# Patient Record
Sex: Male | Born: 1985 | Race: White | Hispanic: No | Marital: Married | Smoking: Current every day smoker
Health system: Southern US, Community
[De-identification: ages and names within clinical notes are randomized; demographics above are authoritative.]

## PROBLEM LIST (undated history)

## (undated) DIAGNOSIS — K311 Adult hypertrophic pyloric stenosis: Secondary | ICD-10-CM

## (undated) DIAGNOSIS — F418 Other specified anxiety disorders: Secondary | ICD-10-CM

## (undated) HISTORY — PX: PYLOROMYOTOMY: SUR1063

---

## 2005-10-23 ENCOUNTER — Emergency Department (HOSPITAL_COMMUNITY): Admission: EM | Admit: 2005-10-23 | Discharge: 2005-10-23 | Payer: Self-pay | Admitting: Emergency Medicine

## 2005-11-01 ENCOUNTER — Ambulatory Visit: Payer: Self-pay | Admitting: Family Medicine

## 2005-11-10 ENCOUNTER — Encounter (HOSPITAL_COMMUNITY): Admission: RE | Admit: 2005-11-10 | Discharge: 2005-12-10 | Payer: Self-pay | Admitting: Endocrinology

## 2005-11-18 ENCOUNTER — Ambulatory Visit: Payer: Self-pay | Admitting: Family Medicine

## 2005-11-30 ENCOUNTER — Emergency Department (HOSPITAL_COMMUNITY): Admission: EM | Admit: 2005-11-30 | Discharge: 2005-11-30 | Payer: Self-pay | Admitting: Emergency Medicine

## 2006-01-03 ENCOUNTER — Ambulatory Visit: Payer: Self-pay | Admitting: Family Medicine

## 2006-01-18 ENCOUNTER — Ambulatory Visit: Payer: Self-pay | Admitting: Family Medicine

## 2006-09-13 ENCOUNTER — Emergency Department (HOSPITAL_COMMUNITY): Admission: EM | Admit: 2006-09-13 | Discharge: 2006-09-14 | Payer: Self-pay | Admitting: Emergency Medicine

## 2006-09-17 ENCOUNTER — Emergency Department (HOSPITAL_COMMUNITY): Admission: EM | Admit: 2006-09-17 | Discharge: 2006-09-17 | Payer: Self-pay | Admitting: Occupational Therapy

## 2006-11-14 ENCOUNTER — Encounter: Payer: Self-pay | Admitting: Family Medicine

## 2006-11-14 DIAGNOSIS — J438 Other emphysema: Secondary | ICD-10-CM | POA: Insufficient documentation

## 2006-11-14 DIAGNOSIS — M545 Low back pain: Secondary | ICD-10-CM | POA: Insufficient documentation

## 2006-11-14 DIAGNOSIS — E0590 Thyrotoxicosis, unspecified without thyrotoxic crisis or storm: Secondary | ICD-10-CM | POA: Insufficient documentation

## 2006-11-14 DIAGNOSIS — F17200 Nicotine dependence, unspecified, uncomplicated: Secondary | ICD-10-CM | POA: Insufficient documentation

## 2006-11-14 DIAGNOSIS — F329 Major depressive disorder, single episode, unspecified: Secondary | ICD-10-CM

## 2006-11-14 DIAGNOSIS — F411 Generalized anxiety disorder: Secondary | ICD-10-CM | POA: Insufficient documentation

## 2006-11-14 DIAGNOSIS — J45909 Unspecified asthma, uncomplicated: Secondary | ICD-10-CM | POA: Insufficient documentation

## 2006-12-31 ENCOUNTER — Emergency Department (HOSPITAL_COMMUNITY): Admission: EM | Admit: 2006-12-31 | Discharge: 2007-01-01 | Payer: Self-pay | Admitting: Emergency Medicine

## 2007-01-06 ENCOUNTER — Emergency Department (HOSPITAL_COMMUNITY): Admission: EM | Admit: 2007-01-06 | Discharge: 2007-01-06 | Payer: Self-pay | Admitting: Emergency Medicine

## 2007-04-28 ENCOUNTER — Emergency Department (HOSPITAL_COMMUNITY): Admission: EM | Admit: 2007-04-28 | Discharge: 2007-04-28 | Payer: Self-pay | Admitting: Emergency Medicine

## 2007-05-13 ENCOUNTER — Emergency Department (HOSPITAL_COMMUNITY): Admission: EM | Admit: 2007-05-13 | Discharge: 2007-05-13 | Payer: Self-pay | Admitting: Emergency Medicine

## 2007-10-14 ENCOUNTER — Emergency Department (HOSPITAL_COMMUNITY): Admission: EM | Admit: 2007-10-14 | Discharge: 2007-10-14 | Payer: Self-pay | Admitting: Emergency Medicine

## 2007-11-18 ENCOUNTER — Emergency Department (HOSPITAL_COMMUNITY): Admission: EM | Admit: 2007-11-18 | Discharge: 2007-11-18 | Payer: Self-pay | Admitting: Emergency Medicine

## 2008-01-20 ENCOUNTER — Emergency Department (HOSPITAL_COMMUNITY): Admission: EM | Admit: 2008-01-20 | Discharge: 2008-01-20 | Payer: Self-pay | Admitting: Emergency Medicine

## 2008-03-03 ENCOUNTER — Emergency Department (HOSPITAL_COMMUNITY): Admission: EM | Admit: 2008-03-03 | Discharge: 2008-03-03 | Payer: Self-pay | Admitting: Emergency Medicine

## 2008-06-01 ENCOUNTER — Emergency Department (HOSPITAL_COMMUNITY): Admission: EM | Admit: 2008-06-01 | Discharge: 2008-06-01 | Payer: Self-pay | Admitting: Emergency Medicine

## 2008-08-20 ENCOUNTER — Ambulatory Visit (HOSPITAL_COMMUNITY): Admission: RE | Admit: 2008-08-20 | Discharge: 2008-08-20 | Payer: Self-pay | Admitting: Family Medicine

## 2008-08-22 ENCOUNTER — Emergency Department (HOSPITAL_COMMUNITY): Admission: EM | Admit: 2008-08-22 | Discharge: 2008-08-22 | Payer: Self-pay | Admitting: Emergency Medicine

## 2009-02-17 ENCOUNTER — Emergency Department (HOSPITAL_COMMUNITY): Admission: EM | Admit: 2009-02-17 | Discharge: 2009-02-17 | Payer: Self-pay | Admitting: Emergency Medicine

## 2009-04-09 ENCOUNTER — Emergency Department (HOSPITAL_COMMUNITY): Admission: EM | Admit: 2009-04-09 | Discharge: 2009-04-09 | Payer: Self-pay | Admitting: Emergency Medicine

## 2009-06-21 ENCOUNTER — Emergency Department (HOSPITAL_COMMUNITY): Admission: EM | Admit: 2009-06-21 | Discharge: 2009-06-21 | Payer: Self-pay | Admitting: Emergency Medicine

## 2009-11-07 ENCOUNTER — Emergency Department (HOSPITAL_COMMUNITY): Admission: EM | Admit: 2009-11-07 | Discharge: 2009-11-07 | Payer: Self-pay | Admitting: Emergency Medicine

## 2010-04-03 ENCOUNTER — Emergency Department (HOSPITAL_COMMUNITY): Admission: EM | Admit: 2010-04-03 | Discharge: 2010-04-03 | Payer: Self-pay | Admitting: Emergency Medicine

## 2010-09-30 ENCOUNTER — Emergency Department (HOSPITAL_COMMUNITY)
Admission: EM | Admit: 2010-09-30 | Discharge: 2010-09-30 | Payer: Self-pay | Source: Home / Self Care | Admitting: Emergency Medicine

## 2011-01-10 LAB — BASIC METABOLIC PANEL
BUN: 17 mg/dL (ref 6–23)
CO2: 28 mEq/L (ref 19–32)
Calcium: 9.6 mg/dL (ref 8.4–10.5)
GFR calc non Af Amer: >60 mL/min (ref >60)
Glucose, Bld: 112 mg/dL — ABNORMAL HIGH (ref 70–99)
Potassium: 3.1 mEq/L — ABNORMAL LOW (ref 3.5–5.1)

## 2011-01-10 LAB — URINALYSIS, ROUTINE W REFLEX MICROSCOPIC
Leukocytes, UA: NEGATIVE
Nitrite: NEGATIVE
Protein, ur: 30 mg/dL — AB

## 2011-01-16 LAB — CBC
HCT: 44.3 % (ref 39.0–52.0)
Hemoglobin: 15.1 g/dL (ref 13.0–17.0)
MCHC: 34.2 g/dL (ref 30.0–36.0)
MCV: 98.4 fL (ref 78.0–100.0)
RBC: 4.5 MIL/uL (ref 4.22–5.81)
WBC: 11.6 10*3/uL — ABNORMAL HIGH (ref 4.0–10.5)

## 2011-01-16 LAB — URINALYSIS, ROUTINE W REFLEX MICROSCOPIC
Ketones, ur: NEGATIVE mg/dL
Nitrite: NEGATIVE
pH: 7.5 (ref 5.0–8.0)

## 2011-01-16 LAB — DIFFERENTIAL
Basophils Relative: 1 % (ref 0–1)
Eosinophils Absolute: 0.2 10*3/uL (ref 0.0–0.7)
Eosinophils Relative: 2 % (ref 0–5)
Lymphs Abs: 3.8 10*3/uL (ref 0.7–4.0)
Monocytes Absolute: 1 10*3/uL (ref 0.1–1.0)
Monocytes Relative: 9 % (ref 3–12)

## 2011-01-16 LAB — BASIC METABOLIC PANEL
CO2: 29 mEq/L (ref 19–32)
Chloride: 102 mEq/L (ref 96–112)
Creatinine, Ser: 0.82 mg/dL (ref 0.4–1.5)
GFR calc Af Amer: >60 mL/min (ref >60)
Potassium: 3.3 mEq/L — ABNORMAL LOW (ref 3.5–5.1)
Sodium: 139 mEq/L (ref 135–145)

## 2011-01-16 LAB — URINE MICROSCOPIC-ADD ON

## 2011-01-17 LAB — COMPREHENSIVE METABOLIC PANEL
AST: 17 U/L (ref 0–37)
Albumin: 4.6 g/dL (ref 3.5–5.2)
Alkaline Phosphatase: 103 U/L (ref 39–117)
BUN: 16 mg/dL (ref 6–23)
CO2: 28 mEq/L (ref 19–32)
Chloride: 103 mEq/L (ref 96–112)
GFR calc non Af Amer: >60 mL/min (ref >60)
Potassium: 3.3 mEq/L — ABNORMAL LOW (ref 3.5–5.1)
Total Bilirubin: 0.6 mg/dL (ref 0.3–1.2)

## 2011-01-17 LAB — URINALYSIS, ROUTINE W REFLEX MICROSCOPIC
Bilirubin Urine: NEGATIVE
Glucose, UA: NEGATIVE mg/dL
Hgb urine dipstick: NEGATIVE
Protein, ur: NEGATIVE mg/dL
Urobilinogen, UA: 2 mg/dL — ABNORMAL HIGH (ref 0.0–1.0)

## 2011-01-17 LAB — DIFFERENTIAL
Basophils Absolute: 0 10*3/uL (ref 0.0–0.1)
Basophils Relative: 0 % (ref 0–1)
Eosinophils Relative: 2 % (ref 0–5)
Monocytes Absolute: 0.9 10*3/uL (ref 0.1–1.0)
Neutro Abs: 8.9 10*3/uL — ABNORMAL HIGH (ref 1.7–7.7)

## 2011-01-17 LAB — CBC
HCT: 42.4 % (ref 39.0–52.0)
Platelets: 171 10*3/uL (ref 150–400)
RBC: 4.36 MIL/uL (ref 4.22–5.81)
WBC: 13.4 10*3/uL — ABNORMAL HIGH (ref 4.0–10.5)

## 2011-02-05 LAB — URINALYSIS, ROUTINE W REFLEX MICROSCOPIC
Bilirubin Urine: NEGATIVE
Ketones, ur: NEGATIVE mg/dL
Nitrite: NEGATIVE
Protein, ur: NEGATIVE mg/dL
Urobilinogen, UA: 0.2 mg/dL (ref 0.0–1.0)

## 2011-02-07 LAB — BASIC METABOLIC PANEL
Calcium: 9.5 mg/dL (ref 8.4–10.5)
GFR calc non Af Amer: >60 mL/min (ref >60)
Glucose, Bld: 138 mg/dL — ABNORMAL HIGH (ref 70–99)
Potassium: 3 mEq/L — ABNORMAL LOW (ref 3.5–5.1)
Sodium: 141 mEq/L (ref 135–145)

## 2011-02-07 LAB — URINALYSIS, ROUTINE W REFLEX MICROSCOPIC
Leukocytes, UA: NEGATIVE
Nitrite: NEGATIVE

## 2011-02-07 LAB — URINE MICROSCOPIC-ADD ON

## 2011-03-18 NOTE — H&P (Signed)
NAME:  George Gross, George Gross                   ACCOUNT NO.:  [id number]   MEDICAL RECORD NO.:  [id number]           PATIENT TYPE:  EMS   LOCATION:  ED                             FACILITY:   PHYSICIAN:  Colleen Can. Deborah Chalk, M.D.DATE OF BIRTH:  09/02/1945   DATE OF ADMISSION:  07/23/2008  DATE OF DISCHARGE:  06/01/2008                              HISTORY & PHYSICAL   CHIEF COMPLAINT:  Chest pain.   HISTORY OF PRESENT ILLNESS:  George Gross is a 25 year old white male who  has known ischemic heart disease.  He has had previous stenting of the  right coronary artery as well as to the left circumflex.  His last  catheterization was in January of this year.  He has had followup stress  testing in July, which was unremarkable.  He presents to the office as a  work-in appointment on July 21, 2008 with complaints of chest  tightness over the past 1 to 2 weeks.  It has been associated with some  left hand and arm numbness and tingling.  His discomfort has been very  fleeting.  His Ranexa was increased over the weekend with really no  significant results.  He does feel somewhat foggy and sleepy.  There was  some concern for possible arrhythmia and a 30-day telemetry monitoring  was ordered.  However, the patient refused.  He continues to just have a  generalized feeling of malaise and is now referred for cardiac  catheterization.   PAST MEDICAL HISTORY:  1. Known ischemic heart disease with previous PCI to the right      coronary.  He has a 2.5 x 28 mm PROMUS stent in the right coronary      artery.  He had a 2.75 x 15 mm PROMUS stent placed to the left      circumflex in January 2009.  2. Hyperlipidemia with concern for possible statin intolerance.  3. Left nephrolithiasis.  4. Longstanding history of colitis.  5. Osteoarthritis.  6. History of left wrist surgery.  7. Left shoulder spur.   ALLERGIES:  None.   CURRENT MEDICATIONS:  1. Nexium 40 mg b.i.d.  2. Plavix 75 mg a day.  3. Vitamin D  50,000 units q. week.  4. Red yeast rice occasionally.  5. Tylenol arthritis daily.  6. Tums twice a day.  7. Aspirin 81 mg a day.  8. Centrum Silver twice a day.  9. Folic acid daily.  10.Vitamin E daily.  11.Vitamin C twice a day.  12.Glucosamine chondroitin 4 tablets a day.  13.Imuran 50 mg b.i.d.  14.Azulfidine 500 mg 4 tablets a day.  15.Prednisone 5 mg a day.   FAMILY HISTORY:  Unchanged.   SOCIAL HISTORY:  Unchanged.   REVIEW OF SYSTEMS:  As noted above.  Is otherwise unremarkable.   EXAM:  CONSTITUTIONAL:  He is currently in no acute distress.  VITAL SIGNS:  His weight is 212-1/2 pounds, blood pressure 130/78, heart  rate 55 and regular, respirations are 18.  He is afebrile.  SKIN:  Warm and dry.  Color is unremarkable.  LUNGS:  Clear.  HEART:  Shows a regular rhythm.  ABDOMEN:  Soft positive bowel sounds, nontender.  EXTREMITIES:  Without edema.  NEUROLOGIC:  Shows no gross focal deficits.   PERTINENT LABS:  Pending.   OVERALL IMPRESSION:  1. Chest pain.  2. Known ischemic heart disease.  3. Hyperlipidemia.  4. History of colitis.   PLAN:  We will proceed on with diagnostic cardiac catheterization.  The  procedure risks and benefits have been reviewed and he is willing to  proceed on Wednesday July 23, 2008.      Sharlee Blew, N.P.      Colleen Can. Deborah Chalk, M.D.  Electronically Signed    LC/MEDQ  D:  07/22/2008  T:  07/22/2008  Job:  [id number]

## 2011-08-01 LAB — URINALYSIS, ROUTINE W REFLEX MICROSCOPIC
Bilirubin Urine: NEGATIVE
Ketones, ur: NEGATIVE
Leukocytes, UA: NEGATIVE
Nitrite: NEGATIVE
Specific Gravity, Urine: >1.03 — ABNORMAL HIGH
Urobilinogen, UA: 0.2
pH: 5.5

## 2011-08-01 LAB — URINE MICROSCOPIC-ADD ON

## 2011-08-08 LAB — URINALYSIS, ROUTINE W REFLEX MICROSCOPIC
Bilirubin Urine: NEGATIVE
Hgb urine dipstick: NEGATIVE
Ketones, ur: NEGATIVE
Nitrite: NEGATIVE
Specific Gravity, Urine: 1.02
pH: 7.5

## 2011-08-16 LAB — BASIC METABOLIC PANEL
Chloride: 104
GFR calc Af Amer: >60
GFR calc non Af Amer: >60
Potassium: 3.6
Sodium: 139

## 2011-08-16 LAB — URINE MICROSCOPIC-ADD ON

## 2011-08-16 LAB — CBC
HCT: 39.4
Hemoglobin: 13.8
MCV: 94.1
Platelets: 151
RBC: 4.19 — ABNORMAL LOW
WBC: 8.3

## 2011-08-16 LAB — URINALYSIS, ROUTINE W REFLEX MICROSCOPIC
Bilirubin Urine: NEGATIVE
Glucose, UA: NEGATIVE
Specific Gravity, Urine: >1.03 — ABNORMAL HIGH

## 2011-08-16 LAB — DIFFERENTIAL
Eosinophils Relative: 3
Lymphocytes Relative: 32
Lymphs Abs: 2.6
Monocytes Relative: 10

## 2011-09-09 ENCOUNTER — Encounter: Payer: Self-pay | Admitting: *Deleted

## 2011-09-09 ENCOUNTER — Emergency Department (HOSPITAL_COMMUNITY)
Admission: EM | Admit: 2011-09-09 | Discharge: 2011-09-10 | Disposition: A | Discharge status: Home / Self Care | Payer: Medicaid Other | Attending: Emergency Medicine | Admitting: Emergency Medicine

## 2011-09-09 DIAGNOSIS — H5710 Ocular pain, unspecified eye: Secondary | ICD-10-CM | POA: Insufficient documentation

## 2011-09-09 DIAGNOSIS — F341 Dysthymic disorder: Secondary | ICD-10-CM | POA: Insufficient documentation

## 2011-09-09 DIAGNOSIS — H538 Other visual disturbances: Secondary | ICD-10-CM | POA: Insufficient documentation

## 2011-09-09 DIAGNOSIS — S058X9A Other injuries of unspecified eye and orbit, initial encounter: Secondary | ICD-10-CM | POA: Insufficient documentation

## 2011-09-09 DIAGNOSIS — H539 Unspecified visual disturbance: Secondary | ICD-10-CM | POA: Insufficient documentation

## 2011-09-09 DIAGNOSIS — F17200 Nicotine dependence, unspecified, uncomplicated: Secondary | ICD-10-CM | POA: Insufficient documentation

## 2011-09-09 DIAGNOSIS — T1590XA Foreign body on external eye, part unspecified, unspecified eye, initial encounter: Secondary | ICD-10-CM | POA: Insufficient documentation

## 2011-09-09 DIAGNOSIS — S0500XA Injury of conjunctiva and corneal abrasion without foreign body, unspecified eye, initial encounter: Secondary | ICD-10-CM

## 2011-09-09 DIAGNOSIS — J438 Other emphysema: Secondary | ICD-10-CM | POA: Insufficient documentation

## 2011-09-09 HISTORY — DX: Other specified anxiety disorders: F41.8

## 2011-09-09 MED ORDER — ALBUTEROL SULFATE HFA 108 (90 BASE) MCG/ACT IN AERS: 2.0000 | INHALATION_SPRAY | RESPIRATORY_TRACT | Status: DC | PRN

## 2011-09-09 MED ORDER — FLUORESCEIN SODIUM 1 MG OP STRP
ORAL_STRIP | OPHTHALMIC | Status: AC
  Filled 2011-09-09: qty 1

## 2011-09-09 MED ORDER — CIPROFLOXACIN HCL 0.3 % OP SOLN
1.0000 [drp] | OPHTHALMIC | Status: AC
  Administered 2011-09-10: 1 [drp] via OPHTHALMIC
  Filled 2011-09-09: qty 2.5

## 2011-09-09 MED ORDER — TETRACAINE HCL 0.5 % OP SOLN
1.0000 [drp] | Freq: Once | OPHTHALMIC | Status: DC
  Filled 2011-09-09: qty 2

## 2011-09-09 NOTE — ED Provider Notes (Signed)
Scribed for George Roller, MD, the patient was seen in room APA16A/APA16A . This chart was scribed by Ellie Lunch.   CSN: [id number] Arrival date & time: 09/09/2011 10:10 PM   First MD Initiated Contact with Patient 09/09/11 2310      Chief Complaint  Patient presents with  . Eye Pain  . Foreign Body in Eye    (Consider location/radiation/quality/duration/timing/severity/associated sxs/prior treatment) The history is provided by the patient. No language interpreter was used.   Pt seen at 11:10 PM George Gross is a 25 y.o. male who presents to the Emergency Department complaining of eye pain.  Pt was chopping wood ~5pm today and says a piece of wood hit him in face. Pt c/o acute onset eye pain with some associated blurry vision. Constant sx since onset. Nothing improves or worsens pain. No photophobia, n/v fevers, or chills.  Pt does not wear glasses or corrective lenses.   Past Medical History  Diagnosis Date  . Emphysema   . Asthma   . Depression with anxiety     History reviewed. No pertinent past surgical history.  Family History  Problem Relation Age of Onset  . Thyroid disease Father   . Hypertension Father     History  Substance Use Topics  . Smoking status: Current Everyday Smoker  . Smokeless tobacco: Not on file  . Alcohol Use: No    Review of Systems  Constitutional: Negative for chills.  Eyes: Positive for pain and visual disturbance. Negative for photophobia.  Gastrointestinal: Negative for nausea and vomiting.    Allergies  Bee venom and Other  Home Medications   Current Outpatient Rx  Name Route Sig Dispense Refill  . UNKNOWN TO PATIENT Ophthalmic Apply to eye as needed. EYE WASH(Name UNKNOWN): Consistent use to remove foreign object from eye.       BP 131/87  Pulse 129  Temp(Src) 98.1 F (36.7 C) (Oral)  Resp 20  Ht 5\' 8"  (1.727 m)  Wt 134 lb (60.782 kg)  BMI 20.37 kg/m2  SpO2 100%  Physical Exam  Nursing note and vitals  reviewed. Constitutional: He appears well-developed and well-nourished.  HENT:  Head: Normocephalic and atraumatic.  Mouth/Throat: Oropharynx is clear and moist.  Eyes:       fluorescein and tetracaine drops administered for PE.  Has 3 linear abrasions to the L side of the L cornea  Cardiovascular: Regular rhythm and normal heart sounds.   Pulmonary/Chest: Effort normal and breath sounds normal. No respiratory distress.  Neurological: He is alert. Coordination normal.  Skin: Skin is warm and dry.    ED Course  Procedures (including critical care time) OTHER DATA REVIEWED: Nursing notes, vital signs, and past medical records reviewed.   DIAGNOSTIC STUDIES: Oxygen Saturation is 100% on room air, normal by my interpretation.     1. Corneal abrasion       MDM  Pt has corneal abrasion, no signs of puncture, VS normalized prior to d/c.  Requests albuterol Rx for his occasional DOE and due to recent dx of emphysema and inability to get to his PMD.  I personally performed the services described in this documentation, which was scribed in my presence. The recorded information has been reviewed and considered.           George Roller, MD 09/09/11 [phone]

## 2011-09-09 NOTE — ED Notes (Signed)
Pt was splitting wood earlier and a piece of wood or something flew up in his eye.

## 2011-09-10 MED ORDER — CIPROFLOXACIN HCL 0.3 % OP SOLN
OPHTHALMIC | Status: AC
  Filled 2011-09-10: qty 2.5

## 2012-04-21 ENCOUNTER — Emergency Department (HOSPITAL_COMMUNITY)
Admission: EM | Admit: 2012-04-21 | Discharge: 2012-04-21 | Disposition: A | Discharge status: Home / Self Care | Payer: Medicaid Other

## 2013-01-30 ENCOUNTER — Encounter (HOSPITAL_COMMUNITY): Payer: Self-pay | Admitting: *Deleted

## 2013-01-30 ENCOUNTER — Emergency Department (HOSPITAL_COMMUNITY)
Admission: EM | Admit: 2013-01-30 | Discharge: 2013-01-30 | Disposition: A | Discharge status: Home / Self Care | Payer: MEDICAID | Attending: Emergency Medicine | Admitting: Emergency Medicine

## 2013-01-30 DIAGNOSIS — J438 Other emphysema: Secondary | ICD-10-CM | POA: Insufficient documentation

## 2013-01-30 DIAGNOSIS — J45909 Unspecified asthma, uncomplicated: Secondary | ICD-10-CM | POA: Insufficient documentation

## 2013-01-30 DIAGNOSIS — Z79899 Other long term (current) drug therapy: Secondary | ICD-10-CM | POA: Insufficient documentation

## 2013-01-30 DIAGNOSIS — R000 Tachycardia, unspecified: Secondary | ICD-10-CM | POA: Insufficient documentation

## 2013-01-30 DIAGNOSIS — F1120 Opioid dependence, uncomplicated: Secondary | ICD-10-CM | POA: Insufficient documentation

## 2013-01-30 DIAGNOSIS — F1110 Opioid abuse, uncomplicated: Secondary | ICD-10-CM

## 2013-01-30 DIAGNOSIS — F17200 Nicotine dependence, unspecified, uncomplicated: Secondary | ICD-10-CM | POA: Insufficient documentation

## 2013-01-30 DIAGNOSIS — F341 Dysthymic disorder: Secondary | ICD-10-CM | POA: Insufficient documentation

## 2013-01-30 DIAGNOSIS — F1123 Opioid dependence with withdrawal: Secondary | ICD-10-CM

## 2013-01-30 DIAGNOSIS — F19939 Other psychoactive substance use, unspecified with withdrawal, unspecified: Secondary | ICD-10-CM | POA: Insufficient documentation

## 2013-01-30 MED ORDER — LORAZEPAM 1 MG PO TABS
1.0000 mg | ORAL_TABLET | Freq: Once | ORAL | Status: AC
  Administered 2013-01-30: 1 mg via ORAL
  Filled 2013-01-30: qty 1

## 2013-01-30 MED ORDER — CLONIDINE HCL 0.1 MG PO TABS: 0.1000 mg | ORAL_TABLET | Freq: Three times a day (TID) | ORAL | Status: DC | PRN

## 2013-01-30 MED ORDER — LOPERAMIDE HCL 2 MG PO CAPS: 2.0000 mg | ORAL_CAPSULE | Freq: Four times a day (QID) | ORAL | Status: DC | PRN

## 2013-01-30 MED ORDER — LORAZEPAM 1 MG PO TABS: 1.0000 mg | ORAL_TABLET | Freq: Four times a day (QID) | ORAL | Status: DC | PRN

## 2013-01-30 MED ORDER — CLONIDINE HCL 0.1 MG PO TABS
0.1000 mg | ORAL_TABLET | Freq: Once | ORAL | Status: AC
  Administered 2013-01-30: 0.1 mg via ORAL
  Filled 2013-01-30: qty 1

## 2013-01-30 MED ORDER — ONDANSETRON 4 MG PO TBDP: 4.0000 mg | ORAL_TABLET | Freq: Four times a day (QID) | ORAL | Status: DC | PRN

## 2013-01-30 MED ORDER — ONDANSETRON 4 MG PO TBDP
4.0000 mg | ORAL_TABLET | Freq: Once | ORAL | Status: AC
  Administered 2013-01-30: 4 mg via ORAL
  Filled 2013-01-30: qty 1

## 2013-01-30 NOTE — ED Provider Notes (Signed)
History    27 year old male with opiate dependence. Patient has been abusing prescription opiates for period of several years. He's been unable to quit secondary to wheezing is a withdrawal symptoms. He has 3 small children and states that he needs to quit to be a better father. He last used today. Denies any other drug use aside from occasional marijuana. No suicidal homicidal ideation. No hallucinations. Currently he feels anxious, otherwise no other complaints.  CSN: [id number]  Arrival date & time 01/30/13  1641   First MD Initiated Contact with Patient 01/30/13 1806      Chief Complaint  Patient presents with  . V70.1    (Consider location/radiation/quality/duration/timing/severity/associated sxs/prior treatment) HPI  Past Medical History  Diagnosis Date  . Emphysema   . Asthma   . Depression with anxiety     History reviewed. No pertinent past surgical history.  Family History  Problem Relation Age of Onset  . Thyroid disease Father   . Hypertension Father     History  Substance Use Topics  . Smoking status: Current Every Day Smoker    Types: Cigarettes  . Smokeless tobacco: Not on file  . Alcohol Use: No      Review of Systems  All systems reviewed and negative, other than as noted in HPI.   Allergies  Bee venom and Other  Home Medications   Current Outpatient Rx  Name  Route  Sig  Dispense  Refill  . EXPIRED: albuterol (PROVENTIL HFA;VENTOLIN HFA) 108 (90 BASE) MCG/ACT inhaler   Inhalation   Inhale 2 puffs into the lungs every 4 (four) hours as needed for wheezing or shortness of breath.   1 Inhaler   3   . cloNIDine (CATAPRES) 0.1 MG tablet   Oral   Take 1 tablet (0.1 mg total) by mouth 3 (three) times daily as needed (withdrawal symptoms).   20 tablet   0   . loperamide (IMODIUM) 2 MG capsule   Oral   Take 1 capsule (2 mg total) by mouth 4 (four) times daily as needed for diarrhea or loose stools.   20 capsule   0   . LORazepam  (ATIVAN) 1 MG tablet   Oral   Take 1 tablet (1 mg total) by mouth every 6 (six) hours as needed for anxiety.   20 tablet   0   . ondansetron (ZOFRAN-ODT) 4 MG disintegrating tablet   Oral   Take 1 tablet (4 mg total) by mouth every 6 (six) hours as needed for nausea.   20 tablet   0   . UNKNOWN TO PATIENT   Ophthalmic   Apply to eye as needed. EYE WASH(Name UNKNOWN): Consistent use to remove foreign object from eye.           BP 130/88  Pulse 120  Temp(Src) 97.8 F (36.6 C) (Oral)  Resp 20  Ht 5\' 9"  (1.753 m)  Wt 125 lb (56.7 kg)  BMI 18.45 kg/m2  SpO2 100%  Physical Exam  Nursing note and vitals reviewed. Constitutional: He appears well-developed and well-nourished. No distress.  HENT:  Head: Normocephalic and atraumatic.  Eyes: Conjunctivae are normal. Right eye exhibits no discharge. Left eye exhibits no discharge.  Neck: Neck supple.  Cardiovascular: Regular rhythm and normal heart sounds.  Exam reveals no gallop and no friction rub.   No murmur heard. Mild tachycardia with a regular rhythm  Pulmonary/Chest: Effort normal and breath sounds normal. No respiratory distress.  Abdominal: Soft. He exhibits no distension.  There is no tenderness.  Musculoskeletal: He exhibits no edema and no tenderness.  Neurological: He is alert.  Skin: Skin is warm and dry.  Psychiatric: His behavior is normal. Thought content normal.  Mildly anxious. Responding to questions appropriately. Does not appear to be responding to internal stimuli. No evidence of cognitive impairment.    ED Course  Procedures (including critical care time)  Labs Reviewed - No data to display No results found.   1. Opiate abuse, continuous   2. Opiate withdrawal       MDM  27 year old male with opiate withdrawal symptoms. No suicidal or homicidal ideation. No psychosis. Plan symptomatic treatment. Prescriptions provided. Resources provided. Emergent return precautions  discussed.        Raeford Razor, MD 01/30/13 2219

## 2013-01-30 NOTE — ED Notes (Signed)
Pt wants detox from opioids, last took 3-4 hours ago, pt tearful in triage room, pt denies SI/HI

## 2013-01-30 NOTE — ED Notes (Signed)
Discharge instructions reviewed with pt, questions answered. Pt verbalized understanding.  

## 2013-06-17 ENCOUNTER — Encounter (HOSPITAL_COMMUNITY): Payer: Self-pay | Admitting: Emergency Medicine

## 2013-06-17 ENCOUNTER — Emergency Department (HOSPITAL_COMMUNITY)
Admission: EM | Admit: 2013-06-17 | Discharge: 2013-06-17 | Disposition: A | Discharge status: Home / Self Care | Payer: Medicaid Other | Attending: Emergency Medicine | Admitting: Emergency Medicine

## 2013-06-17 DIAGNOSIS — R220 Localized swelling, mass and lump, head: Secondary | ICD-10-CM | POA: Insufficient documentation

## 2013-06-17 DIAGNOSIS — Z8659 Personal history of other mental and behavioral disorders: Secondary | ICD-10-CM | POA: Insufficient documentation

## 2013-06-17 DIAGNOSIS — R599 Enlarged lymph nodes, unspecified: Secondary | ICD-10-CM | POA: Insufficient documentation

## 2013-06-17 DIAGNOSIS — F17200 Nicotine dependence, unspecified, uncomplicated: Secondary | ICD-10-CM | POA: Insufficient documentation

## 2013-06-17 DIAGNOSIS — L988 Other specified disorders of the skin and subcutaneous tissue: Secondary | ICD-10-CM | POA: Insufficient documentation

## 2013-06-17 DIAGNOSIS — J45909 Unspecified asthma, uncomplicated: Secondary | ICD-10-CM | POA: Insufficient documentation

## 2013-06-17 DIAGNOSIS — K047 Periapical abscess without sinus: Secondary | ICD-10-CM

## 2013-06-17 MED ORDER — NAPROXEN 500 MG PO TABS: 500.0000 mg | ORAL_TABLET | Freq: Two times a day (BID) | ORAL | Status: DC

## 2013-06-17 MED ORDER — HYDROCODONE-ACETAMINOPHEN 5-325 MG PO TABS
1.0000 | ORAL_TABLET | Freq: Once | ORAL | Status: AC
  Administered 2013-06-17: 1 via ORAL
  Filled 2013-06-17: qty 1

## 2013-06-17 MED ORDER — CLINDAMYCIN HCL 150 MG PO CAPS: 150.0000 mg | ORAL_CAPSULE | Freq: Four times a day (QID) | ORAL | Status: DC

## 2013-06-17 MED ORDER — HYDROCODONE-ACETAMINOPHEN 5-325 MG PO TABS: 1.0000 | ORAL_TABLET | ORAL | Status: DC | PRN

## 2013-06-17 MED ORDER — CLINDAMYCIN HCL 150 MG PO CAPS
300.0000 mg | ORAL_CAPSULE | Freq: Once | ORAL | Status: AC
  Administered 2013-06-17: 300 mg via ORAL
  Filled 2013-06-17: qty 2

## 2013-06-17 NOTE — ED Provider Notes (Signed)
History/physical exam/procedure(s) were performed by non-physician practitioner and as supervising physician I was immediately available for consultation/collaboration. I have reviewed all notes and am in agreement with care and plan.   Hilario Quarry, MD 06/17/13 2350

## 2013-06-17 NOTE — ED Notes (Signed)
Patient c/o dental abscess.

## 2013-06-17 NOTE — ED Provider Notes (Signed)
CSN: [id number]     Arrival date & time 06/17/13  2034 History     First MD Initiated Contact with Patient 06/17/13 2102     Chief Complaint  Patient presents with  . Dental Pain   (Consider location/radiation/quality/duration/timing/severity/associated sxs/prior Treatment) HPI George Gross is a 27 y.o. male who presents to the ED with pain in his left jaw. He describes the pain as throbbing. The pain started a few days ago and has gotten worse with swelling. He has a pimple on the left side of his face that he has been picking and thought that may be the source of infection. He denies fever. He does have a dentist and had his wisdom teeth removed and is planning another appointment.   Past Medical History  Diagnosis Date  . Emphysema   . Asthma   . Depression with anxiety    Past Surgical History  Procedure Laterality Date  . Pyloromyotomy     Family History  Problem Relation Age of Onset  . Thyroid disease Father   . Hypertension Father    History  Substance Use Topics  . Smoking status: Current Every Day Smoker    Types: Cigarettes  . Smokeless tobacco: Not on file  . Alcohol Use: Yes    Review of Systems  Constitutional: Negative for fever and chills.  HENT: Positive for dental problem. Negative for neck pain.   Respiratory: Negative for shortness of breath.   Gastrointestinal: Negative for nausea and vomiting.  Skin: Positive for wound (pimple on face).  Neurological: Negative for headaches.  Psychiatric/Behavioral: The patient is not nervous/anxious.     Allergies  Bee venom and Other  Home Medications   Current Outpatient Rx  Name  Route  Sig  Dispense  Refill  . ibuprofen (ADVIL,MOTRIN) 200 MG tablet   Oral   Take 800 mg by mouth 3 (three) times daily as needed for pain.         Marland Kitchen EXPIRED: albuterol (PROVENTIL HFA;VENTOLIN HFA) 108 (90 BASE) MCG/ACT inhaler   Inhalation   Inhale 2 puffs into the lungs every 4 (four) hours as needed for wheezing or  shortness of breath.   1 Inhaler   3    BP 115/70  Pulse 105  Temp(Src) 98.6 F (37 C) (Oral)  Resp 24  Wt 130 lb (58.968 kg)  BMI 19.19 kg/m2  SpO2 100% Physical Exam  Nursing note and vitals reviewed. Constitutional: He is oriented to person, place, and time. He appears well-developed and well-nourished.  HENT:  Head:    Mouth/Throat: Uvula is midline, oropharynx is clear and moist and mucous membranes are normal. Dental abscesses present.    There is a small raised pustular area on the left side of the face that is tender with palpation. There is no facial erythema noted.  There is tenderness on palpation of the gum surrounding the first and second lower molars on the left. There is a swollen fluctuant area palpated on the gum.  This swelling is separate from the pustular area on the face.   Eyes: EOM are normal.  Neck: Normal range of motion. Neck supple.  Cardiovascular: Normal rate.   Pulmonary/Chest: Effort normal.  Abdominal: Soft. There is no tenderness.  Musculoskeletal: Normal range of motion.  Lymphadenopathy:    He has cervical adenopathy (left).  Neurological: He is alert and oriented to person, place, and time. No cranial nerve deficit.  Skin: Skin is warm and dry.  Psychiatric: He has a  normal mood and affect. His behavior is normal.    ED Course   Procedures   MDM  27 y.o. male with dental abscess and facial swelling. Will treat with antibiotics and pain medication and the patient will follow up with his dentist as soon as possible.  Discussed with the patient and all questioned fully answered. He will return if any problems arise.    Medication List    TAKE these medications       clindamycin 150 MG capsule  Commonly known as:  CLEOCIN  Take 1 capsule (150 mg total) by mouth every 6 (six) hours.     HYDROcodone-acetaminophen 5-325 MG per tablet  Commonly known as:  NORCO/VICODIN  Take 1 tablet by mouth every 4 (four) hours as needed.      naproxen 500 MG tablet  Commonly known as:  NAPROSYN  Take 1 tablet (500 mg total) by mouth 2 (two) times daily.      ASK your doctor about these medications       albuterol 108 (90 BASE) MCG/ACT inhaler  Commonly known as:  PROVENTIL HFA;VENTOLIN HFA  Inhale 2 puffs into the lungs every 4 (four) hours as needed for wheezing or shortness of breath.     ibuprofen 200 MG tablet  Commonly known as:  ADVIL,MOTRIN  Take 800 mg by mouth 3 (three) times daily as needed for pain.         Janne Napoleon, [place] 06/17/13 2132

## 2013-06-17 NOTE — ED Notes (Signed)
Pt has swelling of rt jaw, Pt says he picks at his face and thinks abscess is due to this.  Denies any tooth problem.  Alert,

## 2014-03-04 ENCOUNTER — Emergency Department (HOSPITAL_COMMUNITY)
Admission: EM | Admit: 2014-03-04 | Discharge: 2014-03-04 | Disposition: A | Discharge status: Home / Self Care | Payer: Medicaid Other | Attending: Emergency Medicine | Admitting: Emergency Medicine

## 2014-03-04 ENCOUNTER — Encounter (HOSPITAL_COMMUNITY): Payer: Self-pay | Admitting: Emergency Medicine

## 2014-03-04 DIAGNOSIS — R0602 Shortness of breath: Secondary | ICD-10-CM | POA: Insufficient documentation

## 2014-03-04 DIAGNOSIS — Z792 Long term (current) use of antibiotics: Secondary | ICD-10-CM | POA: Insufficient documentation

## 2014-03-04 DIAGNOSIS — Z8659 Personal history of other mental and behavioral disorders: Secondary | ICD-10-CM | POA: Insufficient documentation

## 2014-03-04 DIAGNOSIS — F17200 Nicotine dependence, unspecified, uncomplicated: Secondary | ICD-10-CM | POA: Insufficient documentation

## 2014-03-04 DIAGNOSIS — J45909 Unspecified asthma, uncomplicated: Secondary | ICD-10-CM | POA: Insufficient documentation

## 2014-03-04 DIAGNOSIS — R55 Syncope and collapse: Secondary | ICD-10-CM | POA: Insufficient documentation

## 2014-03-04 DIAGNOSIS — Z79899 Other long term (current) drug therapy: Secondary | ICD-10-CM | POA: Insufficient documentation

## 2014-03-04 DIAGNOSIS — R42 Dizziness and giddiness: Secondary | ICD-10-CM | POA: Insufficient documentation

## 2014-03-04 DIAGNOSIS — R61 Generalized hyperhidrosis: Secondary | ICD-10-CM | POA: Insufficient documentation

## 2014-03-04 DIAGNOSIS — E876 Hypokalemia: Secondary | ICD-10-CM | POA: Insufficient documentation

## 2014-03-04 LAB — I-STAT CHEM 8, ED
BUN: 21 mg/dL (ref 6–23)
CHLORIDE: 106 meq/L (ref 96–112)
CREATININE: 1.1 mg/dL (ref 0.50–1.35)
Calcium, Ion: 1.18 mmol/L (ref 1.12–1.23)
Glucose, Bld: 96 mg/dL (ref 70–99)
HCT: 46 % (ref 39.0–52.0)
Hemoglobin: 15.6 g/dL (ref 13.0–17.0)
POTASSIUM: 2.8 meq/L — AB (ref 3.7–5.3)
SODIUM: 144 meq/L (ref 137–147)
TCO2: 25 mmol/L (ref 0–100)

## 2014-03-04 MED ORDER — SODIUM CHLORIDE 0.9 % IV BOLUS (SEPSIS)
1000.0000 mL | Freq: Once | INTRAVENOUS | Status: AC
  Administered 2014-03-04: 1000 mL via INTRAVENOUS

## 2014-03-04 MED ORDER — ALBUTEROL SULFATE HFA 108 (90 BASE) MCG/ACT IN AERS: 1.0000 | INHALATION_SPRAY | Freq: Four times a day (QID) | RESPIRATORY_TRACT | Status: AC | PRN

## 2014-03-04 MED ORDER — POTASSIUM CHLORIDE CRYS ER 20 MEQ PO TBCR
40.0000 meq | EXTENDED_RELEASE_TABLET | Freq: Once | ORAL | Status: AC
  Administered 2014-03-04: 40 meq via ORAL
  Filled 2014-03-04: qty 2

## 2014-03-04 NOTE — ED Notes (Signed)
Pt was moving items in basement, started having shortness of breath and generalized weakness.

## 2014-03-04 NOTE — Discharge Instructions (Signed)
Hypokalemia Hypokalemia means that the amount of potassium in the blood is lower than normal.Potassium is a chemical, called an electrolyte, that helps regulate the amount of fluid in the body. It also stimulates muscle contraction and helps nerves function properly.Most of the body's potassium is inside of cells, and only a very small amount is in the blood. Because the amount in the blood is so small, minor changes can be life-threatening. CAUSES  Antibiotics.  Diarrhea or vomiting.  Using laxatives too much, which can cause diarrhea.  Chronic kidney disease.  Water pills (diuretics).  Eating disorders (bulimia).  Low magnesium level.  Sweating a lot. SIGNS AND SYMPTOMS  Weakness.  Constipation.  Fatigue.  Muscle cramps.  Mental confusion.  Skipped heartbeats or irregular heartbeat (palpitations).  Tingling or numbness. DIAGNOSIS  Your health care provider can diagnose hypokalemia with blood tests. In addition to checking your potassium level, your health care provider may also check other lab tests. TREATMENT Hypokalemia can be treated with potassium supplements taken by mouth or adjustments in your current medicines. If your potassium level is very low, you may need to get potassium through a vein (IV) and be monitored in the hospital. A diet high in potassium is also helpful. Foods high in potassium are:  Nuts, such as peanuts and pistachios.  Seeds, such as sunflower seeds and pumpkin seeds.  Peas, lentils, and lima beans.  Whole grain and bran cereals and breads.  Fresh fruit and vegetables, such as apricots, avocado, bananas, cantaloupe, kiwi, oranges, tomatoes, asparagus, and potatoes.  Orange and tomato juices.  Red meats.  Fruit yogurt. HOME CARE INSTRUCTIONS  Take all medicines as prescribed by your health care provider.  Maintain a healthy diet by including nutritious food, such as fruits, vegetables, nuts, whole grains, and lean meats.  If  you are taking a laxative, be sure to follow the directions on the label. SEEK MEDICAL CARE IF:  Your weakness gets worse.  You feel your heart pounding or racing.  You are vomiting or having diarrhea.  You are diabetic and having trouble keeping your blood glucose in the normal range. SEEK IMMEDIATE MEDICAL CARE IF:  You have chest pain, shortness of breath, or dizziness.  You are vomiting or having diarrhea for more than 2 days.  You faint. MAKE SURE YOU:   Understand these instructions.  Will watch your condition.  Will get help right away if you are not doing well or get worse. Document Released: 10/17/2005 Document Revised: 08/07/2013 Document Reviewed: 04/19/2013 [employer] Patient Information 2014 [employer].  

## 2014-03-04 NOTE — ED Provider Notes (Signed)
CSN: [id number]     Arrival date & time 03/04/14  2104 History  This chart was scribed for Joya Gaskinsonald W Addyson Traub, MD by Dorothey Basemania Sutton, ED Scribe. This patient was seen in room APA01/APA01 and the patient's care was started at 9:31 PM.    Chief Complaint  Patient presents with  . Weakness   Patient is a 28 y.o. male presenting with weakness. The history is provided by the patient. No language interpreter was used.  Weakness This is a new problem. The current episode started 3 to 5 hours ago. The problem occurs constantly. The problem has not changed since onset.Associated symptoms include shortness of breath. Pertinent negatives include no chest pain. Nothing aggravates the symptoms. Nothing relieves the symptoms. He has tried rest for the symptoms. The treatment provided mild relief.   HPI Comments: George Gross is a 28 y.o. Male with a history of emphysema  who presents to the Emergency Department complaining of dizziness, lightheadedness, and weakness to the bilateral legs earlier today while moving boxes in his basement.  Soon after he felt SOB.  Patient states that his symptoms presented about 10 minutes after he started moving the boxes and were alleviated after he lay down to rest. Patient states that his symptoms have been gradually improving, but that the lightheadedness has been most persistent. He denies any potential exposure to mold or allergens in the basement. He states that he has been eating and drinking normally today. Patient denies taking any medications today. He denies syncope, chest pain, vomiting, diarrhea, blood in the stool. He denies any recent extended travel/prolonged immobilization or surgeries. He denies illicit drug use. Patient also has a history of depression with anxiety. Patient is a current everyday smoker.   Past Medical History  Diagnosis Date  . Emphysema   . Asthma   . Depression with anxiety    Past Surgical History  Procedure Laterality Date  . Pyloromyotomy      Family History  Problem Relation Age of Onset  . Thyroid disease Father   . Hypertension Father    History  Substance Use Topics  . Smoking status: Current Every Day Smoker    Types: Cigarettes  . Smokeless tobacco: Not on file  . Alcohol Use: Yes    Review of Systems  Constitutional: Positive for diaphoresis. Negative for appetite change.  Respiratory: Positive for shortness of breath.   Cardiovascular: Negative for chest pain.  Gastrointestinal: Negative for vomiting, diarrhea and blood in stool.  Neurological: Positive for dizziness, weakness and light-headedness. Negative for syncope.  Psychiatric/Behavioral: The patient is nervous/anxious.   All other systems reviewed and are negative.     Allergies  Bee venom and Other  Home Medications   Prior to Admission medications   Medication Sig Start Date End Date Taking? Authorizing Provider  albuterol (PROVENTIL HFA;VENTOLIN HFA) 108 (90 BASE) MCG/ACT inhaler Inhale 2 puffs into the lungs every 4 (four) hours as needed for wheezing or shortness of breath. 09/09/11 09/08/12  Vida RollerBrian D Miller, MD  clindamycin (CLEOCIN) 150 MG capsule Take 1 capsule (150 mg total) by mouth every 6 (six) hours. 06/17/13   Hope Orlene OchM Neese, NP  HYDROcodone-acetaminophen (NORCO/VICODIN) 5-325 MG per tablet Take 1 tablet by mouth every 4 (four) hours as needed. 06/17/13   Hope Orlene OchM Neese, NP  ibuprofen (ADVIL,MOTRIN) 200 MG tablet Take 800 mg by mouth 3 (three) times daily as needed for pain.    Historical Provider, MD  naproxen (NAPROSYN) 500 MG tablet Take 1  tablet (500 mg total) by mouth 2 (two) times daily. 06/17/13   Hope Orlene OchM Neese, NP   Triage Vitals: BP 155/98  Pulse 108  Temp(Src) 97.6 F (36.4 C) (Oral)  Resp 16  SpO2 100%  Physical Exam CONSTITUTIONAL: Well developed/well nourished, anxious HEAD: Normocephalic/atraumatic EYES: EOMI/PERRL ENMT: Mucous membranes moist NECK: supple no meningeal signs SPINE:entire spine nontender CV: S1/S2  noted, no murmurs/rubs/gallops noted LUNGS: Lungs are clear to auscultation bilaterally, no apparent distress ABDOMEN: soft, nontender, no rebound or guarding GU:no cva tenderness NEURO: Pt is awake/alert, moves all extremitiesx4, no arm or leg drift, no facial droop, no ataxia EXTREMITIES: pulses normal, full ROM SKIN: warm, color normal PSYCH: anxious  ED Course  Procedures   DIAGNOSTIC STUDIES: Oxygen Saturation is 100% on room air, normal by my interpretation.    COORDINATION OF CARE: 9:37 PM- Ordered EKG, I-stat chem 8. Ordered IV fluids and potassium chloride to manage symptoms. Discussed treatment plan with patient at bedside and patient verbalized agreement.  Pt tachycardic upon standing He appears anxious but no other acute findings He reported that he felt lightheaded while moving boxes, then became anxious and SOB I doubt ACS/PE at this time He is now improve Hypokalemia noted and this was replenished He requests albuterol for discharge. Advised to quit smoking Referred to PCP for further testing and repeat potassium   Labs Review Labs Reviewed  I-STAT CHEM 8, ED - Abnormal; Notable for the following:    Potassium 2.8 (*)    All other components within normal limits      EKG Interpretation   Date/Time:  Tuesday Mar 04 2014 21:24:38 EDT Ventricular Rate:  104 PR Interval:  201 QRS Duration: 104 QT Interval:  368 QTC Calculation: 484 R Axis:   70 Text Interpretation:  Sinus tachycardia Borderline prolonged PR interval  Right atrial enlargement Consider right ventricular hypertrophy Borderline  prolonged QT interval No previous ECGs available Confirmed by Bebe ShaggyWICKLINE   MD, Dorinda HillNALD ([id number]) on 03/04/2014 9:36:02 PM      MDM   Final diagnoses:  Near syncope  Hypokalemia    Nursing notes including past medical history and social history reviewed and considered in documentation Labs/vital reviewed and considered    I personally performed the services  described in this documentation, which was scribed in my presence. The recorded information has been reviewed and is accurate.       Joya Gaskinsonald W Christropher Gintz, MD 03/04/14 [phone]

## 2014-07-31 ENCOUNTER — Emergency Department (HOSPITAL_COMMUNITY)
Admission: EM | Admit: 2014-07-31 | Discharge: 2014-07-31 | Disposition: A | Discharge status: Home / Self Care | Payer: Medicaid Other | Attending: Emergency Medicine | Admitting: Emergency Medicine

## 2014-07-31 ENCOUNTER — Encounter (HOSPITAL_COMMUNITY): Payer: Self-pay | Admitting: Emergency Medicine

## 2014-07-31 DIAGNOSIS — S61512A Laceration without foreign body of left wrist, initial encounter: Secondary | ICD-10-CM | POA: Diagnosis not present

## 2014-07-31 DIAGNOSIS — Z79899 Other long term (current) drug therapy: Secondary | ICD-10-CM | POA: Insufficient documentation

## 2014-07-31 DIAGNOSIS — Z8659 Personal history of other mental and behavioral disorders: Secondary | ICD-10-CM | POA: Diagnosis not present

## 2014-07-31 DIAGNOSIS — W260XXA Contact with knife, initial encounter: Secondary | ICD-10-CM | POA: Diagnosis not present

## 2014-07-31 DIAGNOSIS — Z23 Encounter for immunization: Secondary | ICD-10-CM | POA: Insufficient documentation

## 2014-07-31 DIAGNOSIS — W208XXA Other cause of strike by thrown, projected or falling object, initial encounter: Secondary | ICD-10-CM | POA: Insufficient documentation

## 2014-07-31 DIAGNOSIS — Z720 Tobacco use: Secondary | ICD-10-CM | POA: Insufficient documentation

## 2014-07-31 DIAGNOSIS — Y9289 Other specified places as the place of occurrence of the external cause: Secondary | ICD-10-CM | POA: Diagnosis not present

## 2014-07-31 DIAGNOSIS — Y9389 Activity, other specified: Secondary | ICD-10-CM | POA: Diagnosis not present

## 2014-07-31 DIAGNOSIS — J45909 Unspecified asthma, uncomplicated: Secondary | ICD-10-CM | POA: Insufficient documentation

## 2014-07-31 DIAGNOSIS — J439 Emphysema, unspecified: Secondary | ICD-10-CM | POA: Diagnosis not present

## 2014-07-31 MED ORDER — BACITRACIN-NEOMYCIN-POLYMYXIN 400-5-5000 EX OINT
TOPICAL_OINTMENT | Freq: Once | CUTANEOUS | Status: AC
  Administered 2014-07-31: 1 via TOPICAL
  Filled 2014-07-31: qty 1

## 2014-07-31 MED ORDER — NAPROXEN 500 MG PO TABS: 500.0000 mg | ORAL_TABLET | Freq: Two times a day (BID) | ORAL | Status: DC

## 2014-07-31 MED ORDER — LIDOCAINE HCL (PF) 1 % IJ SOLN
INTRAMUSCULAR | Status: AC
  Administered 2014-07-31: 01:00:00
  Filled 2014-07-31: qty 5

## 2014-07-31 MED ORDER — TETANUS-DIPHTH-ACELL PERTUSSIS 5-2.5-18.5 LF-MCG/0.5 IM SUSP
0.5000 mL | Freq: Once | INTRAMUSCULAR | Status: AC
  Administered 2014-07-31: 0.5 mL via INTRAMUSCULAR
  Filled 2014-07-31: qty 0.5

## 2014-07-31 NOTE — ED Notes (Signed)
Pt has laceration to the left wrist. Pt states he was messing around with knives and one fell off the counter striking his left wrist. Pt denies any si/hi ideations.

## 2014-07-31 NOTE — ED Provider Notes (Signed)
Medical screening examination/treatment/procedure(s) were conducted as a shared visit with non-physician practitioner(s) and myself.  I personally evaluated the patient during the encounter. Patient is a 28 year old male who presents with complaints of left wrist laceration that occurred when he states he dropped a block of knives.  On exam, vitals are stable and the patient is afebrile. Head is atraumatic, normocephalic. Neck is supple. Examination of the left wrist reveals a 4 cm laceration to the distal forearm on the ulnar side. There is no tendon involvement and he is able to flex and extend his wrist and fingers without limitation.  [facility] as per her documentation. The wound was dressed. He is to perform local wound care and suture removal in one week.     Geoffery Lyonsouglas Feleica Fulmore, MD 07/31/14 [phone]

## 2014-07-31 NOTE — ED Provider Notes (Signed)
CSN: [id number]     Arrival date & time 07/31/14  0000 History   First MD Initiated Contact with Patient 07/31/14 0017     Chief Complaint  Patient presents with  . Laceration     (Consider location/radiation/quality/duration/timing/severity/associated sxs/prior Treatment) Patient is a 28 y.o. male presenting with skin laceration. The history is provided by the patient.  Laceration Location:  Shoulder/arm Shoulder/arm laceration location:  L wrist Depth:  Through underlying tissue Quality: straight   Bleeding: controlled   Time since incident:  30 minutes Laceration mechanism:  Knife Pain details:    Quality:  Aching   Severity:  Mild   Timing:  Constant   Progression:  Unchanged Foreign body present:  No foreign bodies Relieved by:  None tried Worsened by:  Nothing tried Ineffective treatments:  None tried Tetanus status:  Unknown  KERIN CECCHI is a 28 y.o. male who presents to the ED with a laceration to the left wrist. Patient states that he knocked over the wooden block that was holding several knives and when they fell on caught his wrist. He denies causing the laceration on purpose. He denies S/I or H/I. He denies any other injuries.   Past Medical History  Diagnosis Date  . Emphysema   . Asthma   . Depression with anxiety    Past Surgical History  Procedure Laterality Date  . Pyloromyotomy     Family History  Problem Relation Age of Onset  . Thyroid disease Father   . Hypertension Father    History  Substance Use Topics  . Smoking status: Current Every Day Smoker    Types: Cigarettes  . Smokeless tobacco: Not on file  . Alcohol Use: Yes    Review of Systems Negative except as stated in HPI   Allergies  Bee venom and Other  Home Medications   Prior to Admission medications   Medication Sig Start Date End Date Taking? Authorizing Provider  albuterol (PROVENTIL HFA;VENTOLIN HFA) 108 (90 BASE) MCG/ACT inhaler Inhale 1-2 puffs into the lungs every 6  (six) hours as needed for wheezing or shortness of breath. 03/04/14   Joya Gaskins, MD   BP 126/77  Pulse 120  Temp(Src) 99 F (37.2 C) (Oral)  Resp 18  Ht 5\' 8"  (1.727 m)  Wt 130 lb (58.968 kg)  BMI 19.77 kg/m2  SpO2 100% Physical Exam  Nursing note and vitals reviewed. Constitutional: He is oriented to person, place, and time. He appears well-developed and well-nourished. No distress.  HENT:  Head: Normocephalic and atraumatic.  Eyes: EOM are normal.  Neck: Neck supple.  Cardiovascular: Normal rate.   Pulmonary/Chest: Effort normal.  Musculoskeletal: Normal range of motion.       Left wrist: He exhibits tenderness and laceration. He exhibits normal range of motion, no bony tenderness and no deformity.  Full flexion and extension of the wrist without difficulty, good strength and touch sensation.   Neurological: He is alert and oriented to person, place, and time. No cranial nerve deficit.  Skin: Skin is warm and dry.  Psychiatric: He has a normal mood and affect. His behavior is normal.    ED Course  Procedures (including critical care time) Labs Review LACERATION REPAIR Performed by: NEESE,HOPE Authorized by: NEESE,HOPE Consent: Verbal consent obtained. Risks and benefits: risks, benefits and alternatives were discussed Consent given by: patient Patient identity confirmed: provided demographic data Prepped and Draped in normal sterile fashion Wound explored  Laceration Location: left wrist  Laceration Length: 4cm  No Foreign Bodies seen or palpated  Anesthesia: local infiltration  Local anesthetic: lidocaine 1% without epinephrine  Anesthetic total: 2 ml  Irrigation method: syringe Amount of cleaning: standard  Skin closure: 4-0 prolene  Number of sutures: 6  Technique: interrupted  Patient tolerance: Patient tolerated the procedure well with no immediate complications.   MDM  28 y.o. male with accidental laceration to the left wrist just prior  to arrival to the ED. Stable for discharge without neurovascular deficits. He will follow up in 10 days for suture removal. He will return sooner for signs of infection or other problems.    [facility] OchM Neese, NP 07/31/14 [phone]

## 2014-07-31 NOTE — ED Notes (Signed)
NP at bedside.

## 2015-03-05 ENCOUNTER — Encounter (HOSPITAL_COMMUNITY): Payer: Self-pay | Admitting: Emergency Medicine

## 2015-03-05 ENCOUNTER — Emergency Department (HOSPITAL_COMMUNITY)
Admission: EM | Admit: 2015-03-05 | Discharge: 2015-03-05 | Disposition: A | Discharge status: Home / Self Care | Payer: Medicaid Other | Attending: Emergency Medicine | Admitting: Emergency Medicine

## 2015-03-05 DIAGNOSIS — K0889 Other specified disorders of teeth and supporting structures: Secondary | ICD-10-CM

## 2015-03-05 DIAGNOSIS — K088 Other specified disorders of teeth and supporting structures: Secondary | ICD-10-CM | POA: Diagnosis present

## 2015-03-05 DIAGNOSIS — J45909 Unspecified asthma, uncomplicated: Secondary | ICD-10-CM | POA: Insufficient documentation

## 2015-03-05 DIAGNOSIS — Z79899 Other long term (current) drug therapy: Secondary | ICD-10-CM | POA: Insufficient documentation

## 2015-03-05 DIAGNOSIS — R000 Tachycardia, unspecified: Secondary | ICD-10-CM | POA: Diagnosis not present

## 2015-03-05 DIAGNOSIS — J320 Chronic maxillary sinusitis: Secondary | ICD-10-CM | POA: Insufficient documentation

## 2015-03-05 DIAGNOSIS — Z791 Long term (current) use of non-steroidal anti-inflammatories (NSAID): Secondary | ICD-10-CM | POA: Insufficient documentation

## 2015-03-05 DIAGNOSIS — Z720 Tobacco use: Secondary | ICD-10-CM | POA: Diagnosis not present

## 2015-03-05 DIAGNOSIS — F419 Anxiety disorder, unspecified: Secondary | ICD-10-CM | POA: Insufficient documentation

## 2015-03-05 MED ORDER — CEFTRIAXONE SODIUM 1 G IJ SOLR
1.0000 g | Freq: Once | INTRAMUSCULAR | Status: AC
  Administered 2015-03-05: 1 g via INTRAMUSCULAR
  Filled 2015-03-05: qty 10

## 2015-03-05 MED ORDER — LIDOCAINE HCL (PF) 1 % IJ SOLN
INTRAMUSCULAR | Status: AC
  Filled 2015-03-05: qty 5

## 2015-03-05 MED ORDER — IBUPROFEN 800 MG PO TABS
800.0000 mg | ORAL_TABLET | Freq: Once | ORAL | Status: AC
  Administered 2015-03-05: 800 mg via ORAL
  Filled 2015-03-05: qty 1

## 2015-03-05 MED ORDER — CLINDAMYCIN HCL 300 MG PO CAPS: 300.0000 mg | ORAL_CAPSULE | Freq: Three times a day (TID) | ORAL | Status: DC

## 2015-03-05 MED ORDER — ACETAMINOPHEN 325 MG PO TABS
650.0000 mg | ORAL_TABLET | Freq: Once | ORAL | Status: AC
  Administered 2015-03-05: 650 mg via ORAL
  Filled 2015-03-05: qty 2

## 2015-03-05 MED ORDER — IBUPROFEN 800 MG PO TABS: 800.0000 mg | ORAL_TABLET | Freq: Three times a day (TID) | ORAL | Status: DC

## 2015-03-05 MED ORDER — TRAMADOL HCL 50 MG PO TABS: ORAL_TABLET | ORAL | Status: DC

## 2015-03-05 NOTE — ED Notes (Signed)
Patient complaining of upper right dental pain x 1 month. States he has an appointment in September with specialist and appointment with his own dentist on May 18. States he was given antibiotics one month ago but pain has never went away.

## 2015-03-05 NOTE — ED Notes (Signed)
Pain rt upper jaw for 2 mos.  Says his dentist said it was due to root of extracted molar left in jaw. Says that he has sinus infection due to this retained root. The tooth was extracted 3 years ago.  Has appt with dentist 5/18

## 2015-03-05 NOTE — ED Provider Notes (Signed)
CSN: [id number]     Arrival date & time 03/05/15  1633 History   First MD Initiated Contact with Patient 03/05/15 1701     Chief Complaint  Patient presents with  . Dental Pain     (Consider location/radiation/quality/duration/timing/severity/associated sxs/prior Treatment) HPI Comments: Patient is a 29 year old male who presents to the emergency department with a complaint of dental pain, and sinus infection. The patient states that 3 years ago he had a tooth extracted. He continued to have problems and 2 months ago he was noted to have a portion of the root of a tooth that was still present in the gum, and affecting the sinuses. The patient had an [place], but missed this appointment because he had the flu, and was told he could not return. He had an appointment with another dental surgeon, but can't be seen until September. He presents now because he was told by the dentist to come to the emergency department for assistance with infection and his discomfort. Patient denies any high fever. He states that when he blows his nose, he has green discharge with a foul odor to it. He has pain of the gum and of the right frontal area.     Patient is a 29 y.o. male presenting with tooth pain. The history is provided by the patient.  Dental Pain Associated symptoms: congestion   Associated symptoms: no neck pain     Past Medical History  Diagnosis Date  . Emphysema   . Asthma   . Depression with anxiety    Past Surgical History  Procedure Laterality Date  . Pyloromyotomy     Family History  Problem Relation Age of Onset  . Thyroid disease Father   . Hypertension Father    History  Substance Use Topics  . Smoking status: Current Every Day Smoker    Types: Cigarettes  . Smokeless tobacco: Not on file  . Alcohol Use: No    Review of Systems  Constitutional: Negative for activity change.       All ROS Neg except as noted in HPI  HENT:  Positive for congestion, dental problem and sinus pressure. Negative for nosebleeds.   Eyes: Negative for photophobia and discharge.  Respiratory: Positive for cough. Negative for wheezing.   Cardiovascular: Negative for chest pain and palpitations.  Gastrointestinal: Negative for abdominal pain and blood in stool.  Genitourinary: Negative for dysuria, frequency and hematuria.  Musculoskeletal: Negative for back pain, arthralgias and neck pain.  Skin: Negative.   Neurological: Negative for dizziness, seizures and speech difficulty.  Psychiatric/Behavioral: Negative for hallucinations and confusion. The patient is nervous/anxious.       Allergies  Bee venom and Other  Home Medications   Prior to Admission medications   Medication Sig Start Date End Date Taking? Authorizing Provider  albuterol (PROVENTIL HFA;VENTOLIN HFA) 108 (90 BASE) MCG/ACT inhaler Inhale 1-2 puffs into the lungs every 6 (six) hours as needed for wheezing or shortness of breath. 03/04/14   Zadie Rhineonald Wickline, MD  naproxen (NAPROSYN) 500 MG tablet Take 1 tablet (500 mg total) by mouth 2 (two) times daily. 07/31/14   Hope Orlene OchM Neese, NP   BP 158/99 mmHg  Pulse 115  Temp(Src) 98 F (36.7 C) (Oral)  Resp 18  Ht 5\' 9"  (1.753 m)  Wt 135 lb (61.236 kg)  BMI 19.93 kg/m2  SpO2 99% Physical Exam  Constitutional: He is oriented to person, place, and time. He appears well-developed and well-nourished.  Non-toxic appearance.  HENT:  Head: Normocephalic.  Right Ear: Tympanic membrane and external ear normal.  Left Ear: Tympanic membrane and external ear normal.  There is pain to percussion of the sinus area in the maxillary right area.  There is no visible abscess involving the right upper jaw or lower jaw. The airway is patent. There is no swelling under the tongue.  Eyes: EOM and lids are normal. Pupils are equal, round, and reactive to light.  Neck: Normal range of motion. Neck supple. Carotid bruit is not present.   Cardiovascular: Regular rhythm, normal heart sounds, intact distal pulses and normal pulses.  Tachycardia present.   Pulmonary/Chest: Breath sounds normal. No respiratory distress.  Abdominal: Soft. Bowel sounds are normal. There is no tenderness. There is no guarding.  Musculoskeletal: Normal range of motion.  Lymphadenopathy:       Head (right side): No submandibular adenopathy present.       Head (left side): No submandibular adenopathy present.    He has no cervical adenopathy.  Neurological: He is alert and oriented to person, place, and time. He has normal strength. No cranial nerve deficit or sensory deficit.  Skin: Skin is warm and dry.  Psychiatric: He has a normal mood and affect. His speech is normal.  Nursing note and vitals reviewed.   ED Course  Procedures (including critical care time) Labs Review Labs Reviewed - No data to display  Imaging Review No results found.   EKG Interpretation None      MDM   vital signs reviewed. Pulse oximetry is 99% on room air. Within normal limits by my interpretation.  During the examination the patient blew his nose and a does have fluorescent green drainage from his nasal passages. There no hot areas involving the frontal or maxillary sinus area. There is tenderness to percussion.  The patient is treated with intramuscular Rocephin. He will be given a prescription for clindamycin. Prescription for ibuprofen and Ultram also given to the patient. Patient is strongly encouraged to see the oral surgeon as sone as possible to hopefully alleviate this problem.   Final diagnoses:  None    **I have reviewed nursing notes, vital signs, and all appropriate lab and imaging results for this patient.Ivery Quale*    Basya Casavant, PA-C 03/05/15 Flossie [id number]  Margarita Grizzleanielle Ray, MD 03/05/15 2011

## 2015-03-05 NOTE — Discharge Instructions (Signed)
It is important that she keep the dentist informed of the progression of your infection. Please see your dentist/oral surgeon as sone as possible. Please use medications as suggested. Sinusitis Sinusitis is redness, soreness, and inflammation of the paranasal sinuses. Paranasal sinuses are air pockets within the bones of your face (beneath the eyes, the middle of the forehead, or above the eyes). In healthy paranasal sinuses, mucus is able to drain out, and air is able to circulate through them by way of your nose. However, when your paranasal sinuses are inflamed, mucus and air can become trapped. This can allow bacteria and other germs to grow and cause infection. Sinusitis can develop quickly and last only a short time (acute) or continue over a long period (chronic). Sinusitis that lasts for more than 12 weeks is considered chronic.  CAUSES  Causes of sinusitis include:  Allergies.  Structural abnormalities, such as displacement of the cartilage that separates your nostrils (deviated septum), which can decrease the air flow through your nose and sinuses and affect sinus drainage.  Functional abnormalities, such as when the small hairs (cilia) that line your sinuses and help remove mucus do not work properly or are not present. SIGNS AND SYMPTOMS  Symptoms of acute and chronic sinusitis are the same. The primary symptoms are pain and pressure around the affected sinuses. Other symptoms include:  Upper toothache.  Earache.  Headache.  Bad breath.  Decreased sense of smell and taste.  A cough, which worsens when you are lying flat.  Fatigue.  Fever.  Thick drainage from your nose, which often is green and may contain pus (purulent).  Swelling and warmth over the affected sinuses. DIAGNOSIS  Your health care provider will perform a physical exam. During the exam, your health care provider may:  Look in your nose for signs of abnormal growths in your nostrils (nasal  polyps).  Tap over the affected sinus to check for signs of infection.  View the inside of your sinuses (endoscopy) using an imaging device that has a light attached (endoscope). If your health care provider suspects that you have chronic sinusitis, one or more of the following tests may be recommended:  Allergy tests.  Nasal culture. A sample of mucus is taken from your nose, sent to a lab, and screened for bacteria.  Nasal cytology. A sample of mucus is taken from your nose and examined by your health care provider to determine if your sinusitis is related to an allergy. TREATMENT  Most cases of acute sinusitis are related to a viral infection and will resolve on their own within 10 days. Sometimes medicines are prescribed to help relieve symptoms (pain medicine, decongestants, nasal steroid sprays, or saline sprays).  However, for sinusitis related to a bacterial infection, your health care provider will prescribe antibiotic medicines. These are medicines that will help kill the bacteria causing the infection.  Rarely, sinusitis is caused by a fungal infection. In theses cases, your health care provider will prescribe antifungal medicine. For some cases of chronic sinusitis, surgery is needed. Generally, these are cases in which sinusitis recurs more than 3 times per year, despite other treatments. HOME CARE INSTRUCTIONS   Drink plenty of water. Water helps thin the mucus so your sinuses can drain more easily.  Use a humidifier.  Inhale steam 3 to 4 times a day (for example, sit in the bathroom with the shower running).  Apply a warm, moist washcloth to your face 3 to 4 times a day, or as directed  by your health care provider.  Use saline nasal sprays to help moisten and clean your sinuses.  Take medicines only as directed by your health care provider.  If you were prescribed either an antibiotic or antifungal medicine, finish it all even if you start to feel better. SEEK IMMEDIATE  MEDICAL CARE IF:  You have increasing pain or severe headaches.  You have nausea, vomiting, or drowsiness.  You have swelling around your face.  You have vision problems.  You have a stiff neck.  You have difficulty breathing. MAKE SURE YOU:   Understand these instructions.  Will watch your condition.  Will get help right away if you are not doing well or get worse. Document Released: 10/17/2005 Document Revised: 03/03/2014 Document Reviewed: 11/01/2011 [facility] Patient Information 2015 [place]. This information is not intended to replace advice given to you by your health care provider. Make sure you discuss any questions you have with your health care provider.  Dental Pain Toothache is pain in or around a tooth. It may get worse with chewing or with cold or heat.  HOME CARE  Your dentist may use a numbing medicine during treatment. If so, you may need to avoid eating until the medicine wears off. Ask your dentist about this.  Only take medicine as told by your dentist or doctor.  Avoid chewing food near the painful tooth until after all treatment is done. Ask your dentist about this. GET HELP RIGHT AWAY IF:   The problem gets worse or new problems appear.  You have a fever.  There is redness and puffiness (swelling) of the face, jaw, or neck.  You cannot open your mouth.  There is pain in the jaw.  There is very bad pain that is not helped by medicine. MAKE SURE YOU:   Understand these instructions.  Will watch your condition.  Will get help right away if you are not doing well or get worse. Document Released: 04/04/2008 Document Revised: 01/09/2012 Document Reviewed: 04/04/2008 [facility]ExitCare Patient Information 2015 [place], [place]. This information is not intended to replace advice given to you by your health care provider. Make sure you discuss any questions you have with your health care provider.

## 2015-03-14 ENCOUNTER — Emergency Department (HOSPITAL_COMMUNITY)
Admission: EM | Admit: 2015-03-14 | Discharge: 2015-03-14 | Disposition: A | Discharge status: Home / Self Care | Payer: Medicaid Other | Attending: Emergency Medicine | Admitting: Emergency Medicine

## 2015-03-14 ENCOUNTER — Encounter (HOSPITAL_COMMUNITY): Payer: Self-pay

## 2015-03-14 DIAGNOSIS — J45909 Unspecified asthma, uncomplicated: Secondary | ICD-10-CM | POA: Diagnosis not present

## 2015-03-14 DIAGNOSIS — J439 Emphysema, unspecified: Secondary | ICD-10-CM | POA: Diagnosis not present

## 2015-03-14 DIAGNOSIS — F329 Major depressive disorder, single episode, unspecified: Secondary | ICD-10-CM | POA: Insufficient documentation

## 2015-03-14 DIAGNOSIS — R195 Other fecal abnormalities: Secondary | ICD-10-CM | POA: Diagnosis not present

## 2015-03-14 DIAGNOSIS — F419 Anxiety disorder, unspecified: Secondary | ICD-10-CM | POA: Diagnosis not present

## 2015-03-14 DIAGNOSIS — Z792 Long term (current) use of antibiotics: Secondary | ICD-10-CM | POA: Diagnosis not present

## 2015-03-14 DIAGNOSIS — L290 Pruritus ani: Secondary | ICD-10-CM | POA: Diagnosis present

## 2015-03-14 DIAGNOSIS — Z720 Tobacco use: Secondary | ICD-10-CM | POA: Insufficient documentation

## 2015-03-14 DIAGNOSIS — Z791 Long term (current) use of non-steroidal anti-inflammatories (NSAID): Secondary | ICD-10-CM | POA: Insufficient documentation

## 2015-03-14 DIAGNOSIS — Z79899 Other long term (current) drug therapy: Secondary | ICD-10-CM | POA: Diagnosis not present

## 2015-03-14 DIAGNOSIS — B839 Helminthiasis, unspecified: Secondary | ICD-10-CM

## 2015-03-14 MED ORDER — ALBENDAZOLE 200 MG PO TABS
400.0000 mg | ORAL_TABLET | Freq: Once | ORAL | Status: DC
Start: 2015-03-14 — End: 2015-05-28

## 2015-03-14 MED ORDER — ALBENDAZOLE 200 MG PO TABS
400.0000 mg | ORAL_TABLET | Freq: Once | ORAL | Status: DC
  Filled 2015-03-14: qty 2

## 2015-03-14 NOTE — Discharge Instructions (Signed)
Concern for Worms  You were seen today with concern for worms. Otherwise well-appearing.  Given one dose of albendazole. Follow-up with primary care physician. May need a prescription for repeat dosing in 2 weeks.  HOME CARE INSTRUCTIONS   Your caregiver will give you medications. They should be taken as directed. Eggs are easily passed. The whole family often needs treatment even if no symptoms are present. Several treatments may be necessary. A second treatment is usually needed after two weeks to a month.  Maintain strict hygiene. Washing hands often and keeping the nails short is helpful. Children often scratch themselves at night in their sleep so the eggs get under the nail. This causes reinfection by hand to mouth contamination.  Change bedding and clothing daily. These should be washed in hot water and dried. This kills the eggs and stops the life cycle of the worm.  Pets are not known to carry pinworms.  An ointment may be used at night for anal itching.  See your caregiver if problems continue. Document Released: 10/14/2000 Document Revised: 01/09/2012 Document Reviewed: 10/14/2008 [facility] Patient Information 2015 [place], [place]. This information is not intended to replace advice given to you by your health care provider. Make sure you discuss any questions you have with your health care provider.

## 2015-03-14 NOTE — ED Provider Notes (Signed)
CSN: [id number]     Arrival date & time 03/14/15  0134 History   First MD Initiated Contact with Patient 03/14/15 0155     Chief Complaint  Patient presents with  . Anal Itching     (Consider location/radiation/quality/duration/timing/severity/associated sxs/prior Treatment) HPI  The patient presents with his entire family with concerns for intestinal worms.The and one of his sisters were playing with a neighbor several weeks ago and were instructed to pick up dog feces without gloves on. Since that time one of the daughters has endorsed anal itching and was noted to have a "worm in her stool." The mother describes worm as long and round. There is only one child in the house that is symptomatic or has had evidence of worms.   The patient himself is asymptomatic. Was instructed  to be evaluated since they all sleep together in the same room.   Past Medical History  Diagnosis Date  . Emphysema   . Asthma   . Depression with anxiety    Past Surgical History  Procedure Laterality Date  . Pyloromyotomy     Family History  Problem Relation Age of Onset  . Thyroid disease Father   . Hypertension Father    History  Substance Use Topics  . Smoking status: Current Every Day Smoker    Types: Cigarettes  . Smokeless tobacco: Not on file  . Alcohol Use: No    Review of Systems  Gastrointestinal: Negative for nausea, vomiting, abdominal pain and diarrhea.  Skin: Negative for color change.  All other systems reviewed and are negative.     Allergies  Bee venom and Other  Home Medications   Prior to Admission medications   Medication Sig Start Date End Date Taking? Authorizing Provider  albendazole (ALBENZA) 200 MG tablet Take 2 tablets (400 mg total) by mouth once. Repeat dose in 2 weeks. 03/14/15   Shon Batonourtney F Horton, MD  albuterol (PROVENTIL HFA;VENTOLIN HFA) 108 (90 BASE) MCG/ACT inhaler Inhale 1-2 puffs into the lungs every 6 (six) hours as needed for wheezing or shortness of  breath. 03/04/14   Zadie Rhineonald Wickline, MD  clindamycin (CLEOCIN) 300 MG capsule Take 1 capsule (300 mg total) by mouth 3 (three) times daily. 03/05/15   Ivery QualeHobson Bryant, PA-C  ibuprofen (ADVIL,MOTRIN) 800 MG tablet Take 1 tablet (800 mg total) by mouth 3 (three) times daily. 03/05/15   Ivery QualeHobson Bryant, PA-C  naproxen (NAPROSYN) 500 MG tablet Take 1 tablet (500 mg total) by mouth 2 (two) times daily. 07/31/14   Hope Orlene OchM Neese, NP  traMADol (ULTRAM) 50 MG tablet 1 or 2 po q6h prn pain 03/05/15   Ivery QualeHobson Bryant, PA-C   BP 146/102 mmHg  Pulse 117  Temp(Src) 98.3 F (36.8 C) (Oral)  Resp 20  Ht 5\' 8"  (1.727 m)  Wt 130 lb (58.968 kg)  BMI 19.77 kg/m2  SpO2 99% Physical Exam  Constitutional: He is oriented to person, place, and time. He appears well-developed and well-nourished.  HENT:  Head: Normocephalic and atraumatic.  Cardiovascular: Normal rate and regular rhythm.   Pulmonary/Chest: Effort normal. No respiratory distress.  Neurological: He is alert and oriented to person, place, and time.  Skin: Skin is warm and dry.  Psychiatric: He has a normal mood and affect.  Nursing note and vitals reviewed.   ED Course  Procedures (including critical care time) Labs Review Labs Reviewed - No data to display  Imaging Review No results found.   EKG Interpretation None  MDM   Final diagnoses:  Worms in stool    Entire family empirically treated with albendazole 400mg .  No objective evidence of worm infestation.  Family to follow-up with PCP.  After history, exam, and medical workup I feel the patient has been appropriately medically screened and is safe for discharge home. Pertinent diagnoses were discussed with the patient. Patient was given return precautions.  Shon Batonourtney F Horton, MD 03/14/15 [phone]

## 2015-03-14 NOTE — ED Notes (Signed)
Here to be checked for worms. Mothers says they all sleep together.   

## 2015-03-14 NOTE — ED Notes (Signed)
Pt alert & oriented x4, stable gait. Patient given discharge instructions, paperwork & prescription(s). Patient  instructed to stop at the registration desk to finish any additional paperwork. Patient verbalized understanding. Pt left department w/ no further questions. 

## 2015-03-14 NOTE — ED Notes (Signed)
Exposed to dog feces, concerned about worms

## 2015-05-27 ENCOUNTER — Emergency Department (HOSPITAL_COMMUNITY)
Admission: EM | Admit: 2015-05-27 | Discharge: 2015-05-28 | Disposition: A | Discharge status: Home / Self Care | Payer: Medicaid Other | Attending: Emergency Medicine | Admitting: Emergency Medicine

## 2015-05-27 ENCOUNTER — Emergency Department (HOSPITAL_COMMUNITY): Payer: Medicaid Other

## 2015-05-27 ENCOUNTER — Encounter (HOSPITAL_COMMUNITY): Payer: Self-pay

## 2015-05-27 DIAGNOSIS — F418 Other specified anxiety disorders: Secondary | ICD-10-CM | POA: Insufficient documentation

## 2015-05-27 DIAGNOSIS — R52 Pain, unspecified: Secondary | ICD-10-CM

## 2015-05-27 DIAGNOSIS — Z792 Long term (current) use of antibiotics: Secondary | ICD-10-CM | POA: Diagnosis not present

## 2015-05-27 DIAGNOSIS — Z79899 Other long term (current) drug therapy: Secondary | ICD-10-CM | POA: Insufficient documentation

## 2015-05-27 DIAGNOSIS — Z720 Tobacco use: Secondary | ICD-10-CM | POA: Diagnosis not present

## 2015-05-27 DIAGNOSIS — J45901 Unspecified asthma with (acute) exacerbation: Secondary | ICD-10-CM | POA: Diagnosis not present

## 2015-05-27 DIAGNOSIS — R079 Chest pain, unspecified: Secondary | ICD-10-CM | POA: Diagnosis not present

## 2015-05-27 DIAGNOSIS — Z791 Long term (current) use of non-steroidal anti-inflammatories (NSAID): Secondary | ICD-10-CM | POA: Diagnosis not present

## 2015-05-27 NOTE — ED Notes (Signed)
Pt reports left rib pain x 2 days, states he does not know why his rib is hurting and denies injury, states he awoke with it painful

## 2015-05-28 MED ORDER — IBUPROFEN 400 MG PO TABS
600.0000 mg | ORAL_TABLET | Freq: Once | ORAL | Status: AC
  Administered 2015-05-28: 600 mg via ORAL
  Filled 2015-05-28: qty 2

## 2015-05-28 NOTE — ED Provider Notes (Signed)
CSN: [id number]     Arrival date & time 05/27/15  2322 History   First MD Initiated Contact with Patient 05/28/15 0047     Chief Complaint  Patient presents with  . Chest Pain    Patient is a 29 y.o. male presenting with chest pain. The history is provided by the patient.  Chest Pain Pain location:  L lateral chest Pain quality: aching   Pain radiates to:  Does not radiate Duration:  2 days Timing:  Constant Progression:  Unchanged Chronicity:  New Context: no trauma   Relieved by:  Nothing Worsened by:  Certain positions Associated symptoms: shortness of breath   Associated symptoms: no cough and no fever   Associated symptoms comment:  Denies hemoptysis  Pt reports he woke up with left sided rib pain for 2 days No trauma He reports mild SOB No hemoptysis No fever/vomiting  Past Medical History  Diagnosis Date  . Emphysema   . Asthma   . Depression with anxiety    Past Surgical History  Procedure Laterality Date  . Pyloromyotomy     Family History  Problem Relation Age of Onset  . Thyroid disease Father   . Hypertension Father    History  Substance Use Topics  . Smoking status: Current Every Day Smoker    Types: Cigarettes  . Smokeless tobacco: Not on file  . Alcohol Use: No    Review of Systems  Constitutional: Negative for fever.  Respiratory: Positive for shortness of breath. Negative for cough.   Cardiovascular: Positive for chest pain.  All other systems reviewed and are negative.     Allergies  Bee venom and Other  Home Medications   Prior to Admission medications   Medication Sig Start Date End Date Taking? Authorizing Provider  albuterol (PROVENTIL HFA;VENTOLIN HFA) 108 (90 BASE) MCG/ACT inhaler Inhale 1-2 puffs into the lungs every 6 (six) hours as needed for wheezing or shortness of breath. 03/04/14  Yes Zadie Rhine, MD  ibuprofen (ADVIL,MOTRIN) 800 MG tablet Take 1 tablet (800 mg total) by mouth 3 (three) times daily. 03/05/15  Yes  Ivery Quale, PA-C  traMADol (ULTRAM) 50 MG tablet 1 or 2 po q6h prn pain 03/05/15  Yes Ivery Quale, PA-C  albendazole (ALBENZA) 200 MG tablet Take 2 tablets (400 mg total) by mouth once. Repeat dose in 2 weeks. 03/14/15   Shon Baton, MD  clindamycin (CLEOCIN) 300 MG capsule Take 1 capsule (300 mg total) by mouth 3 (three) times daily. 03/05/15   Ivery Quale, PA-C  naproxen (NAPROSYN) 500 MG tablet Take 1 tablet (500 mg total) by mouth 2 (two) times daily. 07/31/14   Hope Orlene Och, NP   BP 143/80 mmHg  Pulse 113  Temp(Src) 98.3 F (36.8 C) (Oral)  Resp 16  Ht  (1.727 m)  Wt 130 lb (58.968 kg)  BMI 19.77 kg/m2  SpO2 100% Physical Exam CONSTITUTIONAL: Well developed/well nourished HEAD: Normocephalic/atraumatic EYES: EOMI/PERRL ENMT: Mucous membranes moist NECK: supple no meningeal signs SPINE/BACK:entire spine nontender CV: S1/S2 noted, no murmurs/rubs/gallops noted LUNGS: Lungs are clear to auscultation bilaterally, no apparent distress Chest - tenderness to left lateral ribs, no bruising/crepitus noted ABDOMEN: soft, nontender, no rebound or guarding, bowel sounds noted throughout abdomen GU:no cva tenderness NEURO: Pt is awake/alert/appropriate, moves all extremitiesx4.  No facial droop.   EXTREMITIES: pulses normal/equal, full ROM, no calf tenderness/edema noted SKIN: warm, color normal PSYCH: no abnormalities of mood noted, alert and oriented to situation  ED Course  Procedures  Imaging Review Dg Chest 2 View  05/28/2015   CLINICAL DATA:  Chest pain. Left anterior lateral pain for 2-3 days.  EXAM: CHEST  2 VIEW  COMPARISON:  08/20/2008. Performed concurrently with left rib series.  FINDINGS: Lungs remain hyperinflated. The cardiomediastinal contours are normal. The lungs are clear. Calcified granuloma in the right lung unchanged. Pulmonary vasculature is normal. No consolidation, pleural effusion, or pneumothorax. No acute osseous abnormalities are seen.  IMPRESSION:  Stable hyperinflation without acute abnormality.   Electronically Signed   By: Rubye Oaks M.D.   On: 05/28/2015 01:18   Dg Ribs Unilateral Left  05/28/2015   CLINICAL DATA:  Left anterior lateral rib pain for 2-3 days.  EXAM: LEFT RIBS - 2 VIEW  COMPARISON:  None.  FINDINGS: The cortical margins of the left ribs are intact. No fracture or destructive rib lesion. The left lung is clear.  IMPRESSION: Negative.   Electronically Signed   By: Rubye Oaks M.D.   On: 05/28/2015 01:18     ED ECG REPORT   Date: 05/28/2015 0040  Rate: 89  Rhythm: normal sinus rhythm  QRS Axis: normal  Intervals: normal  ST/T Wave abnormalities: nonspecific ST changes  Conduction Disutrbances:nonspecific intraventricular conduction delay  MDM   Final diagnoses:  Pain    Nursing notes including past medical history and social history reviewed and considered in documentation xrays/imaging reviewed by myself and considered during evaluation   Pt with pinpoint tenderness to chest wall CXR negative He is well appearing Doubt ACS/PE/Dissection at this time Stable for d/c home   Zadie Rhine, MD 05/28/15 [phone]

## 2015-05-28 NOTE — Discharge Instructions (Signed)

## 2016-02-17 ENCOUNTER — Encounter (HOSPITAL_COMMUNITY): Payer: Self-pay

## 2016-02-17 ENCOUNTER — Emergency Department (HOSPITAL_COMMUNITY)
Admission: EM | Admit: 2016-02-17 | Discharge: 2016-02-17 | Disposition: A | Discharge status: Home / Self Care | Payer: Medicaid Other | Attending: Emergency Medicine | Admitting: Emergency Medicine

## 2016-02-17 DIAGNOSIS — Z79899 Other long term (current) drug therapy: Secondary | ICD-10-CM | POA: Diagnosis not present

## 2016-02-17 DIAGNOSIS — Z791 Long term (current) use of non-steroidal anti-inflammatories (NSAID): Secondary | ICD-10-CM | POA: Diagnosis not present

## 2016-02-17 DIAGNOSIS — K0889 Other specified disorders of teeth and supporting structures: Secondary | ICD-10-CM | POA: Diagnosis not present

## 2016-02-17 DIAGNOSIS — J45909 Unspecified asthma, uncomplicated: Secondary | ICD-10-CM | POA: Diagnosis not present

## 2016-02-17 DIAGNOSIS — F418 Other specified anxiety disorders: Secondary | ICD-10-CM | POA: Diagnosis not present

## 2016-02-17 DIAGNOSIS — F17210 Nicotine dependence, cigarettes, uncomplicated: Secondary | ICD-10-CM | POA: Diagnosis not present

## 2016-02-17 MED ORDER — BUPIVACAINE HCL (PF) 0.5 % IJ SOLN
10.0000 mL | Freq: Once | INTRAMUSCULAR | Status: AC
  Administered 2016-02-17: 10 mL
  Filled 2016-02-17: qty 30

## 2016-02-17 NOTE — ED Notes (Signed)
Pt with right lower dental pain x1 week, broken teeth, rotten teeth, caries present, states he went to the dentist this morning and Dr Gerilyn Pilgrimoonquah placed him on "high dose motrin and amoxicillin."

## 2016-02-17 NOTE — ED Notes (Signed)
Patient verbalizes understanding of discharge instructions, home care and follow up care with primary dentist. Patient ambulatory out of department at this time with family.

## 2016-02-17 NOTE — ED Notes (Signed)
Pt c/o toothache for past few days.

## 2016-02-17 NOTE — ED Provider Notes (Signed)
CSN: [id number]     Arrival date & time 02/17/16  0946 History   First MD Initiated Contact with Patient 02/17/16 1007     Chief Complaint  Patient presents with  . Dental Pain     (Consider location/radiation/quality/duration/timing/severity/associated sxs/prior Treatment) HPI   George Gross is a 30 y.o. male who presents to the Emergency Department complaining of right lower toothache that has been worsening for one week.  He reports decay to the tooth and he went to his dentist this morning and given 800 mg Ibuprofen and amoxil.  He has not gotten the Rx's filled yet.  He sates he he suppose to f/u with an oral surgeon to have 5 teeth extracted.  Pain is worse with cold foods or liquids.  He denies fever, neck pain, facial swelling or difficulty swallowing.      Past Medical History  Diagnosis Date  . Emphysema   . Asthma   . Depression with anxiety    Past Surgical History  Procedure Laterality Date  . Pyloromyotomy     Family History  Problem Relation Age of Onset  . Thyroid disease Father   . Hypertension Father    Social History  Substance Use Topics  . Smoking status: Current Every Day Smoker    Types: Cigarettes  . Smokeless tobacco: None  . Alcohol Use: No    Review of Systems  Constitutional: Negative for fever and appetite change.  HENT: Positive for dental problem. Negative for congestion, facial swelling, sore throat and trouble swallowing.   Eyes: Negative for pain and visual disturbance.  Musculoskeletal: Negative for neck pain and neck stiffness.  Neurological: Negative for dizziness, facial asymmetry and headaches.  Hematological: Negative for adenopathy.  All other systems reviewed and are negative.     Allergies  Bee venom and Other  Home Medications   Prior to Admission medications   Medication Sig Start Date End Date Taking? Authorizing Provider  albuterol (PROVENTIL HFA;VENTOLIN HFA) 108 (90 BASE) MCG/ACT inhaler Inhale 1-2 puffs into  the lungs every 6 (six) hours as needed for wheezing or shortness of breath. 03/04/14  Yes Zadie Rhineonald Wickline, MD  diphenhydrAMINE (BENADRYL) 25 MG tablet Take 25 mg by mouth at bedtime as needed for allergies or sleep.   Yes Historical Provider, MD  ibuprofen (ADVIL,MOTRIN) 800 MG tablet Take 1 tablet (800 mg total) by mouth 3 (three) times daily. Patient taking differently: Take 800 mg by mouth every 6 (six) hours as needed for moderate pain.  03/05/15  Yes Ivery QualeHobson Bryant, PA-C   BP 128/78 mmHg  Pulse 109  Temp(Src) 97.9 F (36.6 C) (Oral)  Resp 18  Wt 58.968 kg  SpO2 100% Physical Exam  Constitutional: He is oriented to person, place, and time. He appears well-developed and well-nourished. No distress.  HENT:  Head: Normocephalic and atraumatic.  Right Ear: Tympanic membrane and ear canal normal.  Left Ear: Tympanic membrane and ear canal normal.  Mouth/Throat: Uvula is midline, oropharynx is clear and moist and mucous membranes are normal. No trismus in the jaw. Dental caries present. No dental abscesses or uvula swelling.  Tenderness and dental caries of the right lower second molar.  No facial swelling, obvious dental abscess, trismus, or sublingual abnml.    Neck: Normal range of motion. Neck supple.  Cardiovascular: Normal rate, regular rhythm and normal heart sounds.   No murmur heard. Pulmonary/Chest: Effort normal and breath sounds normal.  Musculoskeletal: Normal range of motion.  Lymphadenopathy:    He  has no cervical adenopathy.  Neurological: He is alert and oriented to person, place, and time. He exhibits normal muscle tone. Coordination normal.  Skin: Skin is warm and dry.  Nursing note and vitals reviewed.   ED Course  Procedures (including critical care time) Labs Review Labs Reviewed - No data to display  Imaging Review No results found. I have personally reviewed and evaluated these images and lab results as part of my medical decision-making.   EKG  Interpretation None     Procedure:  inferior alveolar nerve block was performed by me using a 25 g needle and 0.5% marcaine approximately 2 mL deposited with improvement.  Pt tolerated procedure well.     MDM   Final diagnoses:  Pain, dental    Pt was just seen by his dentist this morning and has a Rx for 800 mg ibuprofen and amoxil.  I have advised him that narcotics are not indicated.  He is to arrange f/u with an oral surgeon for extraction.      Pauline Aus, PA-C 02/17/16 1138  Bethann Berkshire, MD 02/17/16 1314

## 2017-11-14 ENCOUNTER — Ambulatory Visit: Payer: Self-pay | Admitting: Family

## 2017-11-15 ENCOUNTER — Encounter: Payer: Self-pay | Admitting: Family

## 2017-12-15 ENCOUNTER — Encounter (HOSPITAL_COMMUNITY): Payer: Self-pay

## 2017-12-15 ENCOUNTER — Emergency Department (HOSPITAL_COMMUNITY)
Admission: EM | Admit: 2017-12-15 | Discharge: 2017-12-15 | Disposition: A | Discharge status: Home / Self Care | Payer: Medicaid Other | Attending: Emergency Medicine | Admitting: Emergency Medicine

## 2017-12-15 ENCOUNTER — Other Ambulatory Visit: Payer: Self-pay

## 2017-12-15 DIAGNOSIS — Y939 Activity, unspecified: Secondary | ICD-10-CM | POA: Diagnosis not present

## 2017-12-15 DIAGNOSIS — S0591XA Unspecified injury of right eye and orbit, initial encounter: Secondary | ICD-10-CM | POA: Diagnosis present

## 2017-12-15 DIAGNOSIS — Y288XXA Contact with other sharp object, undetermined intent, initial encounter: Secondary | ICD-10-CM | POA: Insufficient documentation

## 2017-12-15 DIAGNOSIS — Y929 Unspecified place or not applicable: Secondary | ICD-10-CM | POA: Insufficient documentation

## 2017-12-15 DIAGNOSIS — F17210 Nicotine dependence, cigarettes, uncomplicated: Secondary | ICD-10-CM | POA: Insufficient documentation

## 2017-12-15 DIAGNOSIS — Y999 Unspecified external cause status: Secondary | ICD-10-CM | POA: Diagnosis not present

## 2017-12-15 DIAGNOSIS — J45909 Unspecified asthma, uncomplicated: Secondary | ICD-10-CM | POA: Insufficient documentation

## 2017-12-15 DIAGNOSIS — S0501XA Injury of conjunctiva and corneal abrasion without foreign body, right eye, initial encounter: Secondary | ICD-10-CM | POA: Diagnosis not present

## 2017-12-15 MED ORDER — FLUORESCEIN SODIUM 1 MG OP STRP
1.0000 | ORAL_STRIP | Freq: Once | OPHTHALMIC | Status: AC
  Administered 2017-12-15: 1 via OPHTHALMIC
  Filled 2017-12-15: qty 1

## 2017-12-15 MED ORDER — OXYCODONE-ACETAMINOPHEN 5-325 MG PO TABS: 1.0000 | ORAL_TABLET | ORAL | 0 refills | Status: DC | PRN

## 2017-12-15 MED ORDER — ERYTHROMYCIN 5 MG/GM OP OINT
TOPICAL_OINTMENT | Freq: Once | OPHTHALMIC | Status: AC
  Administered 2017-12-15: 03:00:00 via OPHTHALMIC
  Filled 2017-12-15: qty 3.5

## 2017-12-15 MED ORDER — ERYTHROMYCIN 5 MG/GM OP OINT: TOPICAL_OINTMENT | OPHTHALMIC | 0 refills | Status: DC

## 2017-12-15 MED ORDER — TETRACAINE HCL 0.5 % OP SOLN
2.0000 [drp] | Freq: Once | OPHTHALMIC | Status: AC
  Administered 2017-12-15: 2 [drp] via OPHTHALMIC
  Filled 2017-12-15: qty 4

## 2017-12-15 NOTE — ED Provider Notes (Signed)
[facility]   CSN: [id number] Arrival date & time: 12/15/17  0040     History   Chief Complaint Chief Complaint  Patient presents with  . eye injury    HPI George Gross is a 32 y.o. male.  The history is provided by the patient.  He has history of asthma, depression, emphysema, hypothyroidism and comes in after having been struck in the right eye by a stick.  He was picking up some sticks when his dog jumped up into his arms and the stick hit him in the right eye.  Since then, he complains of severe pain in that eye and blurring of vision.  He rates pain a 10/10.  He denies other injury.  He does not know when his last tetanus immunization was.  Past Medical History:  Diagnosis Date  . Asthma   . Depression with anxiety   . Emphysema     Patient Active Problem List   Diagnosis Date Noted  . HYPERTHYROIDISM 11/14/2006  . ANXIETY 11/14/2006  . DISORDER, TOBACCO USE 11/14/2006  . DEPRESSION 11/14/2006  . EMPHYSEMA NEC 11/14/2006  . ASTHMA 11/14/2006  . LOW BACK PAIN 11/14/2006    Past Surgical History:  Procedure Laterality Date  . PYLOROMYOTOMY         Home Medications    Prior to Admission medications   Medication Sig Start Date End Date Taking? Authorizing Provider  albuterol (PROVENTIL HFA;VENTOLIN HFA) 108 (90 BASE) MCG/ACT inhaler Inhale 1-2 puffs into the lungs every 6 (six) hours as needed for wheezing or shortness of breath. 03/04/14   Zadie RhineWickline, Donald, MD  diphenhydrAMINE (BENADRYL) 25 MG tablet Take 25 mg by mouth at bedtime as needed for allergies or sleep.    [provider]  ibuprofen (ADVIL,MOTRIN) 800 MG tablet Take 1 tablet (800 mg total) by mouth 3 (three) times daily. Patient taking differently: Take 800 mg by mouth every 6 (six) hours as needed for moderate pain.  03/05/15   Ivery QualeBryant, Hobson, PA-C    Family History Family History  Problem Relation Age of Onset  . Thyroid disease Father   . Hypertension  Father     Social History Social History   Tobacco Use  . Smoking status: Current Every Day Smoker    Types: Cigarettes  . Smokeless tobacco: Never Used  Substance Use Topics  . Alcohol use: No  . Drug use: No    Comment: last used March 2014     Allergies   Bee venom and Other   Review of Systems Review of Systems  All other systems reviewed and are negative.    Physical Exam Updated Vital Signs BP (!) 146/89   Pulse 85   Temp 97.7 F (36.5 C) (Oral)   Resp 18   Ht 5\' 7"  (1.702 m)   Wt 59 kg (130 lb)   SpO2 100%   BMI 20.36 kg/m   Physical Exam  Nursing note and vitals reviewed.  32 year old male, resting comfortably and in no acute distress. Vital signs are significant for mildly elevated systolic blood pressure. Oxygen saturation is 100%, which is normal. Head is normocephalic and atraumatic. PERRLA, EOMI. Oropharynx is clear.  Moderate erythema of the conjunctiva of the right eye.  No obvious hyphema. Neck is nontender and supple without adenopathy or JVD. Back is nontender and there is no CVA tenderness. Lungs are clear without rales, wheezes, or rhonchi. Chest is nontender. Heart has regular rate and rhythm  without murmur. Abdomen is soft, flat, nontender without masses or hepatosplenomegaly and peristalsis is normoactive. Extremities have no cyanosis or edema, full range of motion is present. Skin is warm and dry without rash. Neurologic: Mental status is normal, cranial nerves are intact, there are no motor or sensory deficits.  ED Treatments / Results   Procedures Procedures  Slit-lamp exam: Large central right corneal abrasion without any foreign body seen.  Anterior chamber is clear without cells or ciliary flare.  I stained with fluorescein and examined with cobalt blue filter confirming large central corneal abrasion without evidence of corneal laceration.  Medications Ordered in ED Medications  tetracaine (PONTOCAINE) 0.5 % ophthalmic  solution 2 drop (not administered)  fluorescein ophthalmic strip 1 strip (not administered)     Initial Impression / Assessment and Plan / ED Course  I have reviewed the triage vital signs and the nursing notes.  Right eye injury.  Old records are reviewed, and tetanus immunization was given in October 2015.  Will anesthetize eye with tetracaine and proceed with slit-lamp exam.  Slit lamp exam confirms large corneal abrasion.  He is discharged with a prescription for erythromycin ophthalmic ointment and oxycodone-acetaminophen.  Referred to ophthalmology if not improving over the next 3 days.  Final Clinical Impressions(s) / ED Diagnoses   Final diagnoses:  Cornea abrasion, right, initial encounter    ED Discharge Orders        Ordered    oxyCODONE-acetaminophen (PERCOCET) 5-325 MG tablet  Every 4 hours PRN     12/15/17 0216    oxyCODONE-acetaminophen (PERCOCET) 5-325 MG tablet  Every 4 hours PRN     12/15/17 0218    erythromycin ophthalmic ointment     12/15/17 0218       Dione Booze, MD 12/15/17 [phone]

## 2017-12-15 NOTE — ED Notes (Signed)
Slit lamp, tetracaine, and ophthalmic strip at bedside.

## 2017-12-15 NOTE — ED Triage Notes (Signed)
Pt states his dog accidentally hit him in the right eye with a stick, states is irritated and painful and diff to open the eye, tearing.

## 2017-12-18 MED FILL — Oxycodone w/ Acetaminophen Tab 5-325 MG: ORAL | Qty: 6 | Status: AC

## 2017-12-29 ENCOUNTER — Encounter (HOSPITAL_COMMUNITY): Payer: Self-pay | Admitting: *Deleted

## 2017-12-29 ENCOUNTER — Emergency Department (HOSPITAL_COMMUNITY)
Admission: EM | Admit: 2017-12-29 | Discharge: 2017-12-29 | Disposition: A | Discharge status: Home / Self Care | Payer: Medicaid Other | Attending: Emergency Medicine | Admitting: Emergency Medicine

## 2017-12-29 ENCOUNTER — Other Ambulatory Visit: Payer: Self-pay

## 2017-12-29 DIAGNOSIS — J45909 Unspecified asthma, uncomplicated: Secondary | ICD-10-CM | POA: Diagnosis not present

## 2017-12-29 DIAGNOSIS — F419 Anxiety disorder, unspecified: Secondary | ICD-10-CM | POA: Diagnosis not present

## 2017-12-29 DIAGNOSIS — E0590 Thyrotoxicosis, unspecified without thyrotoxic crisis or storm: Secondary | ICD-10-CM | POA: Diagnosis not present

## 2017-12-29 DIAGNOSIS — K625 Hemorrhage of anus and rectum: Secondary | ICD-10-CM | POA: Diagnosis present

## 2017-12-29 DIAGNOSIS — R197 Diarrhea, unspecified: Secondary | ICD-10-CM

## 2017-12-29 DIAGNOSIS — F17210 Nicotine dependence, cigarettes, uncomplicated: Secondary | ICD-10-CM | POA: Diagnosis not present

## 2017-12-29 DIAGNOSIS — F329 Major depressive disorder, single episode, unspecified: Secondary | ICD-10-CM | POA: Insufficient documentation

## 2017-12-29 LAB — POC OCCULT BLOOD, ED: Fecal Occult Bld: NEGATIVE

## 2017-12-29 MED ORDER — CIPROFLOXACIN HCL 500 MG PO TABS: 500.0000 mg | ORAL_TABLET | Freq: Two times a day (BID) | ORAL | 0 refills | Status: DC

## 2017-12-29 MED ORDER — METRONIDAZOLE 500 MG PO TABS: 500.0000 mg | ORAL_TABLET | Freq: Two times a day (BID) | ORAL | 0 refills | Status: DC

## 2017-12-29 NOTE — ED Triage Notes (Signed)
Pt states for the last week he has had clear bowel movements with blood mixed in; pt denies any abdominal pain

## 2017-12-29 NOTE — ED Provider Notes (Signed)
[facility] EMERGENCY DEPARTMENT Provider Note   CSN: [id number] Arrival date & time: 12/29/17  0214     History   Chief Complaint Chief Complaint  Patient presents with  . Rectal Bleeding    HPI George Gross is a 32 y.o. male.  The history is provided by the patient.  Rectal Bleeding  Quality:  Bright red Amount:  Moderate Timing:  Intermittent Chronicity:  New Context: diarrhea   Relieved by:  None tried Worsened by:  Nothing Associated symptoms: light-headedness   Associated symptoms: no abdominal pain, no fever, no hematemesis and no vomiting   patient who is otherwise healthy presents with diarrhea.  He reports diarrhea 5-7 days.  He reports over the past few days has been noted bright red blood in his stool.  No melena.  No vomiting.  No fevers.  No travel.  No sick contacts.  Past Medical History:  Diagnosis Date  . Asthma   . Depression with anxiety   . Emphysema     Patient Active Problem List   Diagnosis Date Noted  . HYPERTHYROIDISM 11/14/2006  . ANXIETY 11/14/2006  . DISORDER, TOBACCO USE 11/14/2006  . DEPRESSION 11/14/2006  . EMPHYSEMA NEC 11/14/2006  . ASTHMA 11/14/2006  . LOW BACK PAIN 11/14/2006    Past Surgical History:  Procedure Laterality Date  . PYLOROMYOTOMY         Home Medications    Prior to Admission medications   Medication Sig Start Date End Date Taking? Authorizing Provider  albuterol (PROVENTIL HFA;VENTOLIN HFA) 108 (90 BASE) MCG/ACT inhaler Inhale 1-2 puffs into the lungs every 6 (six) hours as needed for wheezing or shortness of breath. 03/04/14   Zadie Rhine, MD  ciprofloxacin (CIPRO) 500 MG tablet Take 1 tablet (500 mg total) by mouth 2 (two) times daily. One po bid x 7 days 12/29/17   Zadie Rhine, MD  diphenhydrAMINE (BENADRYL) 25 MG tablet Take 25 mg by mouth at bedtime as needed for allergies or sleep.    [provider]  erythromycin ophthalmic ointment Place a 1/2 inch ribbon of ointment into the  lower eyelid. 12/15/17   Dione Booze, MD  metroNIDAZOLE (FLAGYL) 500 MG tablet Take 1 tablet (500 mg total) by mouth 2 (two) times daily. One po bid x 7 days 12/29/17   Zadie Rhine, MD    Family History Family History  Problem Relation Age of Onset  . Thyroid disease Father   . Hypertension Father     Social History Social History   Tobacco Use  . Smoking status: Current Every Day Smoker    Types: Cigarettes  . Smokeless tobacco: Never Used  Substance Use Topics  . Alcohol use: No  . Drug use: No    Comment: last used March 2014     Allergies   Bee venom and Other   Review of Systems Review of Systems  Constitutional: Negative for chills and fever.  Gastrointestinal: Positive for blood in stool and hematochezia. Negative for abdominal pain, hematemesis and vomiting.  Neurological: Positive for light-headedness. Negative for syncope.  All other systems reviewed and are negative.    Physical Exam Updated Vital Signs BP 132/89 (BP Location: Right Arm)   Pulse (!) 102   Temp 97.7 F (36.5 C) (Oral)   Resp 18   Ht 1.702 m (5\' 7" )   Wt 59 kg (130 lb)   SpO2 100%   BMI 20.36 kg/m   Physical Exam CONSTITUTIONAL: Anxious, but no acute distress HEAD: Normocephalic/atraumatic  EYES: EOMI/PERRL no icterus, conjunctival pink ENMT: Mucous membranes moist NECK: supple no meningeal signs SPINE/BACK:entire spine nontender CV: S1/S2 noted, no murmurs/rubs/gallops noted LUNGS: Lungs are clear to auscultation bilaterally, no apparent distress ABDOMEN: soft, nontender, no rebound or guarding, bowel sounds noted throughout abdomen Rectal-no blood or melena noted, stool appears brown, no rectal mass or abscess, nurse present for exam GU:no cva tenderness NEURO: Pt is awake/alert/appropriate, moves all extremitiesx4.  No facial droop.   EXTREMITIES: pulses normal/equal, full ROM SKIN: warm, color normal PSYCH: no abnormalities of mood noted, alert and oriented to   situation   ED Treatments / Results  Labs (all labs ordered are listed, but only abnormal results are displayed) Labs Reviewed  POC OCCULT BLOOD, ED    EKG  EKG Interpretation None       Radiology No results found.  Procedures Procedures (including critical care time)  Medications Ordered in ED Medications - No data to display   Initial Impression / Assessment and Plan / ED Course  I have reviewed the triage vital signs and the nursing notes.  Pertinent labs  results that were available during my care of the patient were reviewed by me and considered in my medical decision making (see chart for details).     Reports diarrhea for nearly a week.  He reports recently has been having bright red blood in his stool.  No blood noted on this exam.  He has no focal abdominal tenderness.  I advised him to stay hydrated, start Imodium over-the-counter, and will give him a course of Cipro and Flagyl for him to take if there is no improvement over the next 48 hours.  Overall my suspicion this is likely a viral illness that will resolve.  He is been referred to GI.  Final Clinical Impressions(s) / ED Diagnoses   Final diagnoses:  Diarrhea of presumed infectious origin    ED Discharge Orders        Ordered    ciprofloxacin (CIPRO) 500 MG tablet  2 times daily,   Status:  Discontinued     12/29/17 0334    metroNIDAZOLE (FLAGYL) 500 MG tablet  2 times daily,   Status:  Discontinued     12/29/17 0334    ciprofloxacin (CIPRO) 500 MG tablet  2 times daily     12/29/17 0339    metroNIDAZOLE (FLAGYL) 500 MG tablet  2 times daily     12/29/17 0339       Zadie RhineWickline, Daryn Pisani, MD 12/29/17 [phone]

## 2017-12-29 NOTE — ED Notes (Signed)
Pt ambulatory to waiting room. Pt verbalized understanding of discharge instructions.   

## 2018-08-26 ENCOUNTER — Emergency Department (HOSPITAL_COMMUNITY)
Admission: EM | Admit: 2018-08-26 | Discharge: 2018-08-26 | Disposition: A | Discharge status: Home / Self Care | Payer: Medicaid Other | Attending: Emergency Medicine | Admitting: Emergency Medicine

## 2018-08-26 ENCOUNTER — Emergency Department (HOSPITAL_COMMUNITY): Payer: Medicaid Other

## 2018-08-26 ENCOUNTER — Encounter (HOSPITAL_COMMUNITY): Payer: Self-pay | Admitting: Emergency Medicine

## 2018-08-26 ENCOUNTER — Other Ambulatory Visit: Payer: Self-pay

## 2018-08-26 DIAGNOSIS — W1849XA Other slipping, tripping and stumbling without falling, initial encounter: Secondary | ICD-10-CM | POA: Insufficient documentation

## 2018-08-26 DIAGNOSIS — Y999 Unspecified external cause status: Secondary | ICD-10-CM | POA: Diagnosis not present

## 2018-08-26 DIAGNOSIS — F17210 Nicotine dependence, cigarettes, uncomplicated: Secondary | ICD-10-CM | POA: Diagnosis not present

## 2018-08-26 DIAGNOSIS — S299XXA Unspecified injury of thorax, initial encounter: Secondary | ICD-10-CM | POA: Diagnosis present

## 2018-08-26 DIAGNOSIS — J45909 Unspecified asthma, uncomplicated: Secondary | ICD-10-CM | POA: Diagnosis not present

## 2018-08-26 DIAGNOSIS — Y929 Unspecified place or not applicable: Secondary | ICD-10-CM | POA: Insufficient documentation

## 2018-08-26 DIAGNOSIS — Y9389 Activity, other specified: Secondary | ICD-10-CM | POA: Diagnosis not present

## 2018-08-26 DIAGNOSIS — S20212A Contusion of left front wall of thorax, initial encounter: Secondary | ICD-10-CM | POA: Diagnosis not present

## 2018-08-26 HISTORY — DX: Adult hypertrophic pyloric stenosis: K31.1

## 2018-08-26 MED ORDER — IBUPROFEN 800 MG PO TABS
800.0000 mg | ORAL_TABLET | Freq: Once | ORAL | Status: AC
  Administered 2018-08-26: 800 mg via ORAL
  Filled 2018-08-26: qty 1

## 2018-08-26 MED ORDER — CYCLOBENZAPRINE HCL 10 MG PO TABS
10.0000 mg | ORAL_TABLET | Freq: Once | ORAL | Status: AC
  Administered 2018-08-26: 10 mg via ORAL
  Filled 2018-08-26: qty 1

## 2018-08-26 MED ORDER — IBUPROFEN 600 MG PO TABS: 600.0000 mg | ORAL_TABLET | Freq: Four times a day (QID) | ORAL | 0 refills | Status: DC

## 2018-08-26 MED ORDER — CYCLOBENZAPRINE HCL 10 MG PO TABS: 10.0000 mg | ORAL_TABLET | Freq: Three times a day (TID) | ORAL | 0 refills | Status: DC

## 2018-08-26 NOTE — ED Triage Notes (Signed)
Pt reports moving a TV yesterday. States that he fell onto TV. C/o pain to LT rib cage that worsens with deep inspiration and movement. No bruising noted.

## 2018-08-26 NOTE — ED Notes (Signed)
EDP at bedside  

## 2018-08-26 NOTE — ED Provider Notes (Signed)
[facility] EMERGENCY DEPARTMENT Provider Note   CSN: [id number] Arrival date & time: 08/26/18  1834     History   Chief Complaint Chief Complaint  Patient presents with  . Rib Injury    HPI George Gross is a 32 y.o. male.  Patient is a 32 year old male who presents to the emergency department with left rib area pain.  The patient states that on yesterday October 26, he was moving a TV.  He stumbled and fell toward the TV injuring the left chest wall and rib area.  He says since that time he has pain with certain movement and he has pain with taking a deep breath or coughing.  No hemoptysis reported.  No fever noted.  Patient presents to the emergency department now for evaluation to ensure that he is not broken rib.  The history is provided by the patient.    Past Medical History:  Diagnosis Date  . Asthma   . Depression with anxiety   . Emphysema   . Pyloric stenosis     Patient Active Problem List   Diagnosis Date Noted  . HYPERTHYROIDISM 11/14/2006  . ANXIETY 11/14/2006  . DISORDER, TOBACCO USE 11/14/2006  . DEPRESSION 11/14/2006  . EMPHYSEMA NEC 11/14/2006  . ASTHMA 11/14/2006  . LOW BACK PAIN 11/14/2006    Past Surgical History:  Procedure Laterality Date  . PYLOROMYOTOMY          Home Medications    Prior to Admission medications   Medication Sig Start Date End Date Taking? Authorizing Provider  albuterol (PROVENTIL HFA;VENTOLIN HFA) 108 (90 BASE) MCG/ACT inhaler Inhale 1-2 puffs into the lungs every 6 (six) hours as needed for wheezing or shortness of breath. 03/04/14   Zadie Rhine, MD  ciprofloxacin (CIPRO) 500 MG tablet Take 1 tablet (500 mg total) by mouth 2 (two) times daily. One po bid x 7 days 12/29/17   Zadie Rhine, MD  diphenhydrAMINE (BENADRYL) 25 MG tablet Take 25 mg by mouth at bedtime as needed for allergies or sleep.    [provider]  erythromycin ophthalmic ointment Place a 1/2 inch ribbon of ointment into the lower  eyelid. 12/15/17   Dione Booze, MD  metroNIDAZOLE (FLAGYL) 500 MG tablet Take 1 tablet (500 mg total) by mouth 2 (two) times daily. One po bid x 7 days 12/29/17   Zadie Rhine, MD    Family History Family History  Problem Relation Age of Onset  . Thyroid disease Father   . Hypertension Father     Social History Social History   Tobacco Use  . Smoking status: Current Every Day Smoker    Packs/day: 1.00    Types: Cigarettes  . Smokeless tobacco: Never Used  Substance Use Topics  . Alcohol use: No  . Drug use: No    Types: Marijuana    Comment: last used March 2014     Allergies   Bee venom and Other   Review of Systems Review of Systems  Constitutional: Negative for activity change.       All ROS Neg except as noted in HPI  HENT: Negative for nosebleeds.   Eyes: Negative for photophobia and discharge.  Respiratory: Negative for cough, shortness of breath and wheezing.        Left chest wall pain  Cardiovascular: Negative for chest pain and palpitations.  Gastrointestinal: Negative for abdominal pain and blood in stool.  Genitourinary: Negative for dysuria, frequency and hematuria.  Musculoskeletal: Negative for arthralgias, back pain  and neck pain.  Skin: Negative.   Neurological: Negative for dizziness, seizures and speech difficulty.  Psychiatric/Behavioral: Negative for confusion and hallucinations.     Physical Exam Updated Vital Signs BP 140/80 (BP Location: Right Arm)   Pulse (!) 128   Temp 98.4 F (36.9 C) (Oral)   Resp 18   Wt 59 kg   SpO2 100%   BMI 20.36 kg/m   Physical Exam  Constitutional: He is oriented to person, place, and time. He appears well-developed and well-nourished.  Non-toxic appearance.  HENT:  Head: Normocephalic.  Right Ear: Tympanic membrane and external ear normal.  Left Ear: Tympanic membrane and external ear normal.  Eyes: Pupils are equal, round, and reactive to light. EOM and lids are normal.  Neck: Normal range of  motion. Neck supple. Carotid bruit is not present.  Cardiovascular: Regular rhythm, normal heart sounds, intact distal pulses and normal pulses. Tachycardia present. Exam reveals no gallop and no friction rub.  No murmur heard. Pulmonary/Chest: Breath sounds normal. No respiratory distress. He exhibits tenderness and bony tenderness. He exhibits no deformity and no retraction.    Abdominal: Soft. Bowel sounds are normal. There is no tenderness. There is no guarding.  Musculoskeletal: Normal range of motion.  Lymphadenopathy:       Head (right side): No submandibular adenopathy present.       Head (left side): No submandibular adenopathy present.    He has no cervical adenopathy.  Neurological: He is alert and oriented to person, place, and time. He has normal strength. No cranial nerve deficit or sensory deficit.  Skin: Skin is warm and dry.  Psychiatric: He has a normal mood and affect. His speech is normal.  Nursing note and vitals reviewed.    ED Treatments / Results  Labs (all labs ordered are listed, but only abnormal results are displayed) Labs Reviewed - No data to display  EKG None  Radiology No results found.  Procedures Procedures (including critical care time)  Medications Ordered in ED Medications - No data to display   Initial Impression / Assessment and Plan / ED Course  I have reviewed the triage vital signs and the nursing notes.  Pertinent labs & imaging results that were available during my care of the patient were reviewed by me and considered in my medical decision making (see chart for details).       Final Clinical Impressions(s) / ED Diagnoses MDM  Patient was tacky on admission to the emergency department with a heart rate of 128.  Pulse oximetry was 100% on room air.  Within normal limits by my interpretation.  There is symmetrical rise and fall of the chest, but with some pain.  Patient had a x-ray of the left ribs which was negative for  fracture or dislocation.  X-ray of the chest shows the lungs to be within normal limits.  No evidence of acute changes or problems.  Patient continues to speak in complete sentences without problem.  Patient is ambulatory at the time of discharge without problem.  Heart rate is improved to 107 at the time of discharge.  I have asked the patient to cough and deep breathe several times each hour.  Patient is given a prescription for muscle relaxer and anti-inflammatory pain medication.  The patient is to follow-up with his primary physician or return to the emergency department if any changes in his condition, problems, or concerns.   Final diagnoses:  Chest wall contusion, left, initial encounter  ED Discharge Orders         Ordered    ibuprofen (ADVIL,MOTRIN) 600 MG tablet  4 times daily     08/26/18 2025    cyclobenzaprine (FLEXERIL) 10 MG tablet  3 times daily     08/26/18 2025           Ivery Quale, PA-C 08/26/18 2034    Donnetta Hutching, MD 08/27/18 [phone]

## 2018-08-26 NOTE — Discharge Instructions (Addendum)
Your vital signs have been reviewed.  Your oxygen level is 100% on room air.  Your chest x-ray is negative for lung issue, or rib fracture.  Please practice coughing or taking a deep breaths several times each hour.  Please use Flexeril 3 times daily, ibuprofen with breakfast, lunch, dinner, and at bedtime.  This medication may cause drowsiness. Please do not drink, drive, or participate in activity that requires concentration while taking this medication. Please see your primary physician or return to the emergency department if not improving.

## 2018-10-17 ENCOUNTER — Emergency Department (HOSPITAL_COMMUNITY)
Admission: EM | Admit: 2018-10-17 | Discharge: 2018-10-17 | Disposition: A | Discharge status: Home / Self Care | Payer: Self-pay | Attending: Emergency Medicine | Admitting: Emergency Medicine

## 2018-10-17 ENCOUNTER — Encounter (HOSPITAL_COMMUNITY): Payer: Self-pay | Admitting: Emergency Medicine

## 2018-10-17 ENCOUNTER — Emergency Department (HOSPITAL_COMMUNITY): Payer: Self-pay

## 2018-10-17 ENCOUNTER — Other Ambulatory Visit: Payer: Self-pay

## 2018-10-17 DIAGNOSIS — Y9389 Activity, other specified: Secondary | ICD-10-CM | POA: Insufficient documentation

## 2018-10-17 DIAGNOSIS — J45909 Unspecified asthma, uncomplicated: Secondary | ICD-10-CM | POA: Insufficient documentation

## 2018-10-17 DIAGNOSIS — Y999 Unspecified external cause status: Secondary | ICD-10-CM | POA: Insufficient documentation

## 2018-10-17 DIAGNOSIS — Y929 Unspecified place or not applicable: Secondary | ICD-10-CM | POA: Insufficient documentation

## 2018-10-17 DIAGNOSIS — F17210 Nicotine dependence, cigarettes, uncomplicated: Secondary | ICD-10-CM | POA: Insufficient documentation

## 2018-10-17 DIAGNOSIS — S0990XA Unspecified injury of head, initial encounter: Secondary | ICD-10-CM | POA: Insufficient documentation

## 2018-10-17 DIAGNOSIS — R079 Chest pain, unspecified: Secondary | ICD-10-CM | POA: Insufficient documentation

## 2018-10-17 DIAGNOSIS — E0590 Thyrotoxicosis, unspecified without thyrotoxic crisis or storm: Secondary | ICD-10-CM | POA: Insufficient documentation

## 2018-10-17 LAB — I-STAT CHEM 8, ED
BUN: 16 mg/dL (ref 6–20)
Calcium, Ion: 1.25 mmol/L (ref 1.15–1.40)
Chloride: 101 mmol/L (ref 98–111)
Creatinine, Ser: 0.9 mg/dL (ref 0.61–1.24)
Glucose, Bld: 105 mg/dL — ABNORMAL HIGH (ref 70–99)
HEMATOCRIT: 48 % (ref 39.0–52.0)
HEMOGLOBIN: 16.3 g/dL (ref 13.0–17.0)
Potassium: 4.1 mmol/L (ref 3.5–5.1)
Sodium: 139 mmol/L (ref 135–145)
TCO2: 29 mmol/L (ref 22–32)

## 2018-10-17 MED ORDER — LEVOFLOXACIN 500 MG PO TABS: 500.0000 mg | ORAL_TABLET | Freq: Every day | ORAL | 0 refills | Status: AC

## 2018-10-17 MED ORDER — HYDROCODONE-ACETAMINOPHEN 5-325 MG PO TABS
1.0000 | ORAL_TABLET | Freq: Once | ORAL | Status: AC
  Administered 2018-10-17: 1 via ORAL
  Filled 2018-10-17: qty 1

## 2018-10-17 MED ORDER — SODIUM CHLORIDE 0.9 % IV BOLUS: 500.0000 mL | Freq: Once | INTRAVENOUS | Status: DC

## 2018-10-17 MED ORDER — IOPAMIDOL (ISOVUE-300) INJECTION 61%
100.0000 mL | Freq: Once | INTRAVENOUS | Status: AC | PRN
  Administered 2018-10-17: 100 mL via INTRAVENOUS

## 2018-10-17 MED ORDER — TRAMADOL HCL 50 MG PO TABS: 50.0000 mg | ORAL_TABLET | Freq: Four times a day (QID) | ORAL | 0 refills | Status: AC | PRN
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
## 2018-10-17 NOTE — Discharge Instructions (Addendum)
Follow-up with the primary care doctor in the next 2 weeks for reevaluation of this possible pneumonia.  Return if any problem
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
## 2021-06-03 ENCOUNTER — Emergency Department (HOSPITAL_COMMUNITY)
Admission: EM | Admit: 2021-06-03 | Discharge: 2021-06-03 | Disposition: A | Discharge status: Home / Self Care | Payer: Medicaid Other | Attending: Emergency Medicine | Admitting: Emergency Medicine

## 2021-06-03 ENCOUNTER — Other Ambulatory Visit: Payer: Self-pay

## 2021-06-03 ENCOUNTER — Encounter (HOSPITAL_COMMUNITY): Payer: Self-pay

## 2021-06-03 DIAGNOSIS — X102XXA Contact with fats and cooking oils, initial encounter: Secondary | ICD-10-CM | POA: Insufficient documentation

## 2021-06-03 DIAGNOSIS — T2103XA Burn of unspecified degree of upper back, initial encounter: Secondary | ICD-10-CM | POA: Diagnosis present

## 2021-06-03 DIAGNOSIS — T310 Burns involving less than 10% of body surface: Secondary | ICD-10-CM | POA: Diagnosis not present

## 2021-06-03 DIAGNOSIS — J45909 Unspecified asthma, uncomplicated: Secondary | ICD-10-CM | POA: Insufficient documentation

## 2021-06-03 DIAGNOSIS — F17210 Nicotine dependence, cigarettes, uncomplicated: Secondary | ICD-10-CM | POA: Insufficient documentation

## 2021-06-03 DIAGNOSIS — T2123XA Burn of second degree of upper back, initial encounter: Secondary | ICD-10-CM | POA: Diagnosis not present

## 2021-06-03 DIAGNOSIS — T3110 Burns involving 10-19% of body surface with 0% to 9% third degree burns: Secondary | ICD-10-CM

## 2021-06-03 MED ORDER — HYDROMORPHONE HCL 1 MG/ML IJ SOLN
INTRAMUSCULAR | Status: AC
  Filled 2021-06-03: qty 2

## 2021-06-03 MED ORDER — OXYCODONE-ACETAMINOPHEN 5-325 MG PO TABS
1.0000 | ORAL_TABLET | ORAL | 0 refills | Status: AC | PRN
Start: 2021-06-03 — End: ?

## 2021-06-03 MED ORDER — FENTANYL CITRATE (PF) 100 MCG/2ML IJ SOLN
200.0000 ug | Freq: Once | INTRAMUSCULAR | Status: AC
  Administered 2021-06-03: 200 ug via INTRAVENOUS
  Filled 2021-06-03: qty 4

## 2021-06-03 MED ORDER — SODIUM CHLORIDE 0.9 % IV BOLUS
1000.0000 mL | Freq: Once | INTRAVENOUS | Status: AC
  Administered 2021-06-03: 1000 mL via INTRAVENOUS

## 2021-06-03 MED ORDER — SILVER SULFADIAZINE 1 % EX CREA
TOPICAL_CREAM | Freq: Once | CUTANEOUS | Status: AC
  Administered 2021-06-03: 2 via TOPICAL

## 2021-06-03 MED ORDER — LORAZEPAM 2 MG/ML IJ SOLN
2.0000 mg | Freq: Once | INTRAMUSCULAR | Status: AC
  Administered 2021-06-03: 2 mg via INTRAVENOUS
  Filled 2021-06-03: qty 1

## 2021-06-03 MED ORDER — HYDROMORPHONE HCL 1 MG/ML IJ SOLN
1.0000 mg | Freq: Once | INTRAMUSCULAR | Status: AC
  Administered 2021-06-03: 1 mg via INTRAVENOUS
  Filled 2021-06-03: qty 1

## 2021-06-03 MED ORDER — SILVER SULFADIAZINE 1 % EX CREA: 1.0000 "application " | TOPICAL_CREAM | Freq: Every day | CUTANEOUS | 0 refills | Status: AC

## 2021-06-03 MED ORDER — HYDROMORPHONE HCL 2 MG/ML IJ SOLN
2.0000 mg | Freq: Once | INTRAMUSCULAR | Status: AC
  Administered 2021-06-03: 2 mg via INTRAVENOUS

## 2021-06-03 MED ORDER — LORAZEPAM 2 MG/ML IJ SOLN
2.0000 mg | Freq: Once | INTRAMUSCULAR | Status: AC
  Administered 2021-06-03: 2 mg via INTRAMUSCULAR
  Filled 2021-06-03: qty 1

## 2021-06-03 MED ORDER — LORAZEPAM 2 MG/ML IJ SOLN
INTRAMUSCULAR | Status: AC
  Administered 2021-06-03: 1 mg via INTRAVENOUS
  Filled 2021-06-03: qty 1
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
## 2021-06-03 NOTE — ED Notes (Signed)
Unable to obtain vitals d/t pt's pain.

## 2021-06-03 NOTE — Discharge Instructions (Addendum)
Return here for a checkup in 2 days.  Wash the Silvadene off, every day and clean the wounds with soap and water.  Have someone help you apply a thin coating of Silvadene to the burns of your back and hands.  Then apply light gauze dressings to protect the area.  Take the medication for pain as directed.

## 2021-06-08 ENCOUNTER — Other Ambulatory Visit: Payer: Self-pay

## 2021-06-08 ENCOUNTER — Encounter (HOSPITAL_COMMUNITY): Payer: Self-pay

## 2021-06-08 ENCOUNTER — Emergency Department (HOSPITAL_COMMUNITY)
Admission: EM | Admit: 2021-06-08 | Discharge: 2021-06-08 | Disposition: A | Discharge status: Home / Self Care | Payer: Medicaid Other | Attending: Emergency Medicine | Admitting: Emergency Medicine

## 2021-06-08 DIAGNOSIS — F17210 Nicotine dependence, cigarettes, uncomplicated: Secondary | ICD-10-CM | POA: Insufficient documentation

## 2021-06-08 DIAGNOSIS — T310 Burns involving less than 10% of body surface: Secondary | ICD-10-CM | POA: Insufficient documentation

## 2021-06-08 DIAGNOSIS — J45909 Unspecified asthma, uncomplicated: Secondary | ICD-10-CM | POA: Diagnosis not present

## 2021-06-08 DIAGNOSIS — T2123XD Burn of second degree of upper back, subsequent encounter: Secondary | ICD-10-CM | POA: Insufficient documentation

## 2021-06-08 DIAGNOSIS — X102XXD Contact with fats and cooking oils, subsequent encounter: Secondary | ICD-10-CM | POA: Insufficient documentation

## 2021-06-08 DIAGNOSIS — Z5189 Encounter for other specified aftercare: Secondary | ICD-10-CM

## 2021-06-08 DIAGNOSIS — Z4800 Encounter for change or removal of nonsurgical wound dressing: Secondary | ICD-10-CM | POA: Insufficient documentation

## 2021-06-08 MED ORDER — HYDROMORPHONE HCL 1 MG/ML IJ SOLN
1.0000 mg | Freq: Once | INTRAMUSCULAR | Status: AC
  Administered 2021-06-08: 1 mg via INTRAMUSCULAR
  Filled 2021-06-08: qty 1

## 2021-06-08 MED ORDER — CEPHALEXIN 500 MG PO CAPS: 500.0000 mg | ORAL_CAPSULE | Freq: Two times a day (BID) | ORAL | 0 refills | Status: AC

## 2021-06-08 MED ORDER — OXYCODONE-ACETAMINOPHEN 5-325 MG PO TABS: 1.0000 | ORAL_TABLET | Freq: Three times a day (TID) | ORAL | 0 refills | Status: AC | PRN

## 2021-06-08 MED ORDER — SILVER SULFADIAZINE 1 % EX CREA
TOPICAL_CREAM | Freq: Once | CUTANEOUS | Status: AC
  Administered 2021-06-08: 1 via TOPICAL
  Filled 2021-06-08: qty 50
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
## 2021-06-08 NOTE — ED Notes (Signed)
Attempted to remove as much dressing as possible with sterile water soak. Pt tearful and appears to be a in a lot of pain prior to, during and after dressing removal. The majority of dressing removed to better visualize wound per PA instruction.
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
## 2021-06-08 NOTE — ED Notes (Signed)
Dressed back with silvadene topical cream, non-adherent dressing and abd pads.

## 2022-02-22 ENCOUNTER — Ambulatory Visit: Payer: Medicaid Other | Admitting: Nurse Practitioner

## 2022-05-18 ENCOUNTER — Ambulatory Visit (HOSPITAL_COMMUNITY)
Admission: RE | Admit: 2022-05-18 | Discharge: 2022-05-18 | Disposition: A | Discharge status: Home / Self Care | Payer: Medicaid Other | Source: Ambulatory Visit | Attending: Physician Assistant | Admitting: Physician Assistant

## 2022-05-18 ENCOUNTER — Other Ambulatory Visit (HOSPITAL_COMMUNITY): Payer: Self-pay | Admitting: Physician Assistant

## 2022-05-18 DIAGNOSIS — S20219A Contusion of unspecified front wall of thorax, initial encounter: Secondary | ICD-10-CM

## 2022-12-30 ENCOUNTER — Other Ambulatory Visit (HOSPITAL_COMMUNITY): Payer: Self-pay

## 2022-12-30 ENCOUNTER — Telehealth: Payer: Self-pay

## 2022-12-30 NOTE — Telephone Encounter (Signed)
[facility] Patient Teacher, English as a foreign language completed.    The patient is insured through [employer] and has a $4.00 copay.  We will continue to follow to see if copay assistance is needed.  Ileene Patrick, [facility] Patient [facility] Phone: [phone] Fax:  [phone]

## 2023-01-02 ENCOUNTER — Encounter: Payer: Medicaid Other | Admitting: Family

## 2023-08-16 ENCOUNTER — Ambulatory Visit: Payer: Medicaid Other | Admitting: Family Medicine

## 2023-08-17 ENCOUNTER — Encounter: Payer: Self-pay | Admitting: General Practice
